# Patient Record
Sex: Female | Born: 1965 | Race: White | Hispanic: No | Smoking: Former smoker
Health system: Southern US, Community
[De-identification: ages and names within clinical notes are randomized; demographics above are authoritative.]

## PROBLEM LIST (undated history)

## (undated) DIAGNOSIS — F329 Major depressive disorder, single episode, unspecified: Secondary | ICD-10-CM

## (undated) DIAGNOSIS — F32A Depression, unspecified: Secondary | ICD-10-CM

## (undated) DIAGNOSIS — E119 Type 2 diabetes mellitus without complications: Secondary | ICD-10-CM

## (undated) DIAGNOSIS — I10 Essential (primary) hypertension: Secondary | ICD-10-CM

## (undated) HISTORY — DX: Major depressive disorder, single episode, unspecified: F32.9

## (undated) HISTORY — DX: Depression, unspecified: F32.A

## (undated) HISTORY — DX: Type 2 diabetes mellitus without complications: E11.9

## (undated) HISTORY — PX: PILONIDAL CYST EXCISION: SHX744

## (undated) HISTORY — DX: Essential (primary) hypertension: I10

## (undated) HISTORY — PX: CHOLECYSTECTOMY: SHX55

## (undated) HISTORY — PX: HERNIA REPAIR: SHX51

## (undated) HISTORY — PX: ABLATION: SHX5711

---

## 2006-01-28 ENCOUNTER — Ambulatory Visit: Payer: Self-pay | Admitting: Family Medicine

## 2006-02-18 ENCOUNTER — Ambulatory Visit: Payer: Self-pay | Admitting: Internal Medicine

## 2006-02-21 ENCOUNTER — Ambulatory Visit: Payer: Self-pay | Admitting: Internal Medicine

## 2006-05-03 ENCOUNTER — Ambulatory Visit: Payer: Self-pay | Admitting: Internal Medicine

## 2006-05-24 ENCOUNTER — Ambulatory Visit: Payer: Self-pay | Admitting: Internal Medicine

## 2007-05-22 ENCOUNTER — Ambulatory Visit: Payer: Self-pay | Admitting: Family Medicine

## 2009-01-05 ENCOUNTER — Ambulatory Visit: Payer: Self-pay | Admitting: Family Medicine

## 2009-07-14 ENCOUNTER — Ambulatory Visit: Payer: Self-pay | Admitting: Family Medicine

## 2011-10-31 ENCOUNTER — Ambulatory Visit: Payer: Self-pay

## 2011-11-08 ENCOUNTER — Ambulatory Visit: Payer: Self-pay

## 2011-11-12 LAB — PATHOLOGY REPORT

## 2014-06-08 ENCOUNTER — Ambulatory Visit: Payer: Self-pay | Admitting: Family Medicine

## 2014-06-21 ENCOUNTER — Ambulatory Visit: Payer: Self-pay | Admitting: Surgery

## 2014-06-21 LAB — BASIC METABOLIC PANEL
ANION GAP: 4 — AB (ref 7–16)
BUN: 12 mg/dL (ref 7–18)
CALCIUM: 9 mg/dL (ref 8.5–10.1)
CO2: 27 mmol/L (ref 21–32)
CREATININE: 1.03 mg/dL (ref 0.60–1.30)
Chloride: 104 mmol/L (ref 98–107)
EGFR (African American): >60
EGFR (Non-African Amer.): >60
Glucose: 139 mg/dL — ABNORMAL HIGH (ref 65–99)
Osmolality: 272 (ref 275–301)
POTASSIUM: 4.1 mmol/L (ref 3.5–5.1)
Sodium: 135 mmol/L — ABNORMAL LOW (ref 136–145)

## 2014-06-21 LAB — CBC WITH DIFFERENTIAL/PLATELET
BASOS PCT: 0.4 %
Basophil #: 0 10*3/uL (ref 0.0–0.1)
EOS ABS: 0.2 10*3/uL (ref 0.0–0.7)
Eosinophil %: 1.7 %
HCT: 40.3 % (ref 35.0–47.0)
HGB: 13.4 g/dL (ref 12.0–16.0)
LYMPHS ABS: 2.8 10*3/uL (ref 1.0–3.6)
Lymphocyte %: 26.5 %
MCH: 28.1 pg (ref 26.0–34.0)
MCHC: 33.3 g/dL (ref 32.0–36.0)
MCV: 84 fL (ref 80–100)
Monocyte #: 0.4 x10 3/mm (ref 0.2–0.9)
Monocyte %: 3.6 %
NEUTROS PCT: 67.8 %
Neutrophil #: 7.1 10*3/uL — ABNORMAL HIGH (ref 1.4–6.5)
Platelet: 283 10*3/uL (ref 150–440)
RBC: 4.78 10*6/uL (ref 3.80–5.20)
RDW: 14.4 % (ref 11.5–14.5)
WBC: 10.5 10*3/uL (ref 3.6–11.0)

## 2014-06-29 ENCOUNTER — Ambulatory Visit: Payer: Self-pay | Admitting: Surgery

## 2014-07-01 LAB — PATHOLOGY REPORT

## 2014-08-24 DIAGNOSIS — E119 Type 2 diabetes mellitus without complications: Secondary | ICD-10-CM

## 2014-08-24 HISTORY — DX: Type 2 diabetes mellitus without complications: E11.9

## 2014-08-31 DIAGNOSIS — E538 Deficiency of other specified B group vitamins: Secondary | ICD-10-CM | POA: Insufficient documentation

## 2014-08-31 DIAGNOSIS — E119 Type 2 diabetes mellitus without complications: Secondary | ICD-10-CM | POA: Insufficient documentation

## 2014-10-25 ENCOUNTER — Ambulatory Visit: Payer: Self-pay | Admitting: Surgery

## 2014-10-25 DIAGNOSIS — I10 Essential (primary) hypertension: Secondary | ICD-10-CM

## 2014-10-25 LAB — BASIC METABOLIC PANEL
ANION GAP: 8 (ref 7–16)
BUN: 18 mg/dL (ref 7–18)
CHLORIDE: 101 mmol/L (ref 98–107)
CREATININE: 0.91 mg/dL (ref 0.60–1.30)
Calcium, Total: 9 mg/dL (ref 8.5–10.1)
Co2: 28 mmol/L (ref 21–32)
Glucose: 129 mg/dL — ABNORMAL HIGH (ref 65–99)
OSMOLALITY: 277 (ref 275–301)
Potassium: 4 mmol/L (ref 3.5–5.1)
SODIUM: 137 mmol/L (ref 136–145)

## 2014-11-02 ENCOUNTER — Ambulatory Visit: Payer: Self-pay | Admitting: Surgery

## 2015-04-16 NOTE — Op Note (Signed)
PATIENT NAME:  Holly Foley, Holly Foley MR#:  [id number] DATE OF BIRTH:  1966-05-01  DATE OF PROCEDURE:  06/29/2014  PREOPERATIVE DIAGNOSIS: Symptomatic cholelithiasis.   POSTOPERATIVE DIAGNOSIS: Symptomatic cholelithiasis.   PROCEDURE PERFORMED: Laparoscopic cholecystectomy.   ANESTHESIA: General endotracheal.   ESTIMATED BLOOD LOSS: Minimal.   COMPLICATIONS: None.   SPECIMENS: Gallbladder.   INDICATION FOR SURGERY: [name] is a pleasant [age] year old with recurrent right upper quadrant pain, nausea and vomiting. She has gallstones on ultrasound and her characteristic appeared to be biliary, thus she was brought to the operating room for laparoscopic cholecystectomy.   DETAILS OF PROCEDURE: Informed consent was obtained. [name] was brought to the operating room suite. She was induced, an endotracheal tube was placed, general anesthesia was administered. Her abdomen was prepped and draped in standard surgical fashion. A timeout was then performed correctly identifying the patient name, operative site and procedure to be performed.   A supraumbilical incision was made. It was deepened down to the fascia. The fascia was incised. The peritoneum was entered. Two stay sutures were placed through the fasciotomy. The abdomen was insufflated. A Hasson trocar was placed in the abdomen. An 11-mm epigastric and two 5-mm right subcostal trocars were placed in the midclavicular area and anterior axillary lines. The gallbladder was lifted up over dome of the liver. There were obvious stones in the gallbladder. The cystic artery and cystic duct were dissected out. The critical view was obtained. Two structures were ligated clipped and ligated. The gallbladder was then taken off the gallbladder fossa with hook cautery and brought out with an Endo Catch bag.   The gallbladder fossa was then irrigated and made hemostatic. The abdomen was irrigated with normal saline. When I was happy with hemostasis, all trocars  were removed under direct visualization. The abdomen was desufflated. The supraumbilical fascia was closed with a figure-of-eight 0-Vicryl. All port sites were infiltrated with 1% lidocaine with epinephrine and closed with interrupted 4-0 Monocryl deep dermal sutures. The wounds were then dressed with Steri-Strips, Telfa gauze and Tegaderm. The patient was then awakened, extubated and brought to the postanesthesia care unit. There were no immediate complications. Needle, sponge, and instrument counts were correct at the end of the procedure.    ____________________________ Si Raiderhristopher A. Charolett Yarrow, MD cal:lt Foley: 06/30/2014 09:02:57 ET T: 06/30/2014 12:29:20 ET JOB#: [id number]  cc: Cristal Deerhristopher A. Dalena Plantz, MD, <Dictator> Jarvis NewcomerHRISTOPHER A Ronelle Smallman MD ELECTRONICALLY SIGNED 07/04/2014 18:12

## 2015-04-16 NOTE — Op Note (Signed)
PATIENT NAME:  Holly Foley, Holly Foley MR#:  [id number] DATE OF BIRTH:  03-Jun-1966  DATE OF PROCEDURE:  11/02/2014  PREOPERATIVE DIAGNOSIS: Incisional hernia.   POSTOPERATIVE DIAGNOSIS: Incisional hernia x 2.   PROCEDURE PERFORMED: Incisional hernia repair with mesh, 8 cm diameter Ventralex light prolene.   ANESTHESIA: General.   ESTIMATED BLOOD LOSS: 10 mL.   COMPLICATIONS: None.   SPECIMENS: Hernia sac.   INDICATION FOR SURGERY: Ms. Holly Foley is a pleasant, 49 year old, active female, who is status post cholecystectomy, presents with a supraumbilical bulge thought to be a hernia. She was brought to the operating room suite for a hernia repair.   DETAILS OF PROCEDURE AS FOLLOWS: Informed consent was obtained. The patient was brought to the operating room suite. She was induced. Endotracheal tube was placed, general anesthesia was administered. Her abdomen was prepped and draped in standard surgical fashion. A timeout was then performed correctly identifying the patient and name, operative site and procedure to be performed.   An incision left of the umbilicus was made. It was deepened down to the fascia. A hernia defect superior to the umbilicus was noted. This was dissected out. The sac was ligated. It was approximately 2 x 1 cm. A 1 x 1 cm defect was found approximately 1 cm up. All defects were connected, after dissected out, and the hernia was repaired using an 8 cm Ventralex patch. This was incorporated into the closure of the wound with 0 Ethibond interrupted sutures incorporating the tail. After this the umbilicus was then reattached with a 3-0 Vicryl inverted U-stitch. The wound was then closed in 2 layers, an interrupted 3-0 Vicryl figure-of-eight for the deep, and then running Monocryl 4-0 subcuticular. Steri-Strips, Telfa gauze and Tegaderm were then used to complete the dressing.   The patient was then awoken, extubated and brought to the postanesthesia care unit. There were no immediate  complications.   Needle, sponge, and instrument counts were correct at the end of the procedure.    ____________________________ Si Raiderhristopher A. Chayne Baumgart, MD cal:JT Foley: 11/04/2014 07:10:08 ET T: 11/04/2014 12:39:41 ET JOB#: [id number]  cc: Cristal Deerhristopher A. Wakisha Alberts, MD, <Dictator> Jarvis NewcomerHRISTOPHER A Lautaro Koral MD ELECTRONICALLY SIGNED 11/07/2014 19:34

## 2015-04-25 ENCOUNTER — Other Ambulatory Visit: Payer: Self-pay | Admitting: Family Medicine

## 2015-04-25 DIAGNOSIS — Z1231 Encounter for screening mammogram for malignant neoplasm of breast: Secondary | ICD-10-CM

## 2015-05-10 ENCOUNTER — Ambulatory Visit
Admission: RE | Admit: 2015-05-10 | Discharge: 2015-05-10 | Disposition: A | Discharge status: Home / Self Care | Payer: Commercial Indemnity | Source: Ambulatory Visit | Attending: Neurology | Admitting: Neurology

## 2015-05-10 DIAGNOSIS — Z1231 Encounter for screening mammogram for malignant neoplasm of breast: Secondary | ICD-10-CM | POA: Insufficient documentation

## 2015-10-06 ENCOUNTER — Other Ambulatory Visit: Payer: Self-pay | Admitting: Family Medicine

## 2015-10-06 ENCOUNTER — Other Ambulatory Visit: Payer: Self-pay

## 2015-10-06 DIAGNOSIS — F32A Depression, unspecified: Secondary | ICD-10-CM

## 2015-10-06 DIAGNOSIS — F329 Major depressive disorder, single episode, unspecified: Secondary | ICD-10-CM

## 2015-10-06 MED ORDER — SERTRALINE HCL 50 MG PO TABS: 50.0000 mg | ORAL_TABLET | Freq: Every day | ORAL | Status: DC

## 2016-01-31 ENCOUNTER — Encounter: Payer: Self-pay | Admitting: Family Medicine

## 2016-01-31 ENCOUNTER — Ambulatory Visit (INDEPENDENT_AMBULATORY_CARE_PROVIDER_SITE_OTHER): Payer: Commercial Indemnity | Admitting: Family Medicine

## 2016-01-31 VITALS — BP 118/64 | HR 80 | Ht 60.0 in | Wt 210.0 lb

## 2016-01-31 DIAGNOSIS — E785 Hyperlipidemia, unspecified: Secondary | ICD-10-CM | POA: Diagnosis not present

## 2016-01-31 DIAGNOSIS — I10 Essential (primary) hypertension: Secondary | ICD-10-CM | POA: Diagnosis not present

## 2016-01-31 DIAGNOSIS — R000 Tachycardia, unspecified: Secondary | ICD-10-CM | POA: Insufficient documentation

## 2016-01-31 DIAGNOSIS — F329 Major depressive disorder, single episode, unspecified: Secondary | ICD-10-CM | POA: Diagnosis not present

## 2016-01-31 DIAGNOSIS — F32A Depression, unspecified: Secondary | ICD-10-CM

## 2016-01-31 DIAGNOSIS — R55 Syncope and collapse: Secondary | ICD-10-CM | POA: Insufficient documentation

## 2016-01-31 HISTORY — DX: Hyperlipidemia, unspecified: E78.5

## 2016-01-31 MED ORDER — VALSARTAN 80 MG PO TABS: 80.0000 mg | ORAL_TABLET | Freq: Every day | ORAL | Status: DC

## 2016-01-31 MED ORDER — SERTRALINE HCL 50 MG PO TABS: 50.0000 mg | ORAL_TABLET | Freq: Every day | ORAL | Status: DC

## 2016-01-31 NOTE — Progress Notes (Signed)
Name: Holly Foley   MRN: [id number]    DOB: 1966/04/29   Date:01/31/2016       Progress Note  Subjective  Chief Complaint  Chief Complaint  Patient presents with  . Hypertension  . Depression    Hypertension This is a chronic problem. The current episode started more than 1 year ago. The problem has been gradually improving since onset. The problem is controlled. Pertinent negatives include no anxiety, blurred vision, chest pain, headaches, malaise/fatigue, neck pain, orthopnea, palpitations, peripheral edema, PND, shortness of breath or sweats. There are no associated agents to hypertension. Risk factors for coronary artery disease include diabetes mellitus, dyslipidemia and obesity. Past treatments include angiotensin blockers. The current treatment provides mild improvement. There are no compliance problems.  There is no history of angina, kidney disease, CAD/MI, CVA, heart failure, left ventricular hypertrophy, PVD, renovascular disease or retinopathy. There is no history of chronic renal disease or a hypertension causing med.  Depression        This is a new problem.  The current episode started more than 1 year ago.   The onset quality is gradual.   The problem occurs intermittently.  The problem has been waxing and waning since onset.  Associated symptoms include no helplessness, no hopelessness, does not have insomnia, not irritable, no myalgias, no headaches, not sad and no suicidal ideas.  Past treatments include SSRIs - Selective serotonin reuptake inhibitors.  Compliance with treatment is good.   Pertinent negatives include no anxiety.   No problem-specific assessment & plan notes found for this encounter.   Past Medical History  Diagnosis Date  . Diabetes mellitus without complication (HCC)   . Depression   . Hypertension     Past Surgical History  Procedure Laterality Date  . Ablation    . Pilonidal cyst excision    . Cholecystectomy    . Hernia repair      No  family history on file.  Social History   Social History  . Marital Status: Unknown    Spouse Name: N/A  . Number of Children: N/A  . Years of Education: N/A   Occupational History  . Not on file.   Social History Main Topics  . Smoking status: Former Games developer  . Smokeless tobacco: Not on file  . Alcohol Use: 0.0 oz/week    0 Standard drinks or equivalent per week  . Drug Use: No  . Sexual Activity: Not on file   Other Topics Concern  . Not on file   Social History Narrative    No Known Allergies   Review of Systems  Constitutional: Negative for fever, chills, weight loss and malaise/fatigue.  HENT: Negative for ear discharge, ear pain and sore throat.   Eyes: Negative for blurred vision.  Respiratory: Negative for cough, sputum production, shortness of breath and wheezing.   Cardiovascular: Negative for chest pain, palpitations, orthopnea, leg swelling and PND.  Gastrointestinal: Negative for heartburn, nausea, abdominal pain, diarrhea, constipation, blood in stool and melena.  Genitourinary: Negative for dysuria, urgency, frequency and hematuria.  Musculoskeletal: Negative for myalgias, back pain, joint pain and neck pain.  Skin: Negative for rash.  Neurological: Negative for dizziness, tingling, sensory change, focal weakness and headaches.  Endo/Heme/Allergies: Negative for environmental allergies and polydipsia. Does not bruise/bleed easily.  Psychiatric/Behavioral: Positive for depression. Negative for suicidal ideas. The patient is not nervous/anxious and does not have insomnia.      Objective  Filed Vitals:   01/31/16 0906  BP:  118/64  Pulse: 80  Height: 5' (1.524 m)  Weight: 210 lb (95.255 kg)    Physical Exam  Constitutional: She is well-developed, well-nourished, and in no distress. She is not irritable. No distress.  HENT:  Head: Normocephalic and atraumatic.  Right Ear: External ear normal.  Left Ear: External ear normal.  Nose: Nose normal.   Mouth/Throat: Oropharynx is clear and moist.  Eyes: Conjunctivae and EOM are normal. Pupils are equal, round, and reactive to light. Right eye exhibits no discharge. Left eye exhibits no discharge.  Neck: Normal range of motion. Neck supple. No JVD present. No thyromegaly present.  Cardiovascular: Normal rate, regular rhythm, normal heart sounds and intact distal pulses.  Exam reveals no gallop and no friction rub.   No murmur heard. Pulmonary/Chest: Effort normal and breath sounds normal.  Abdominal: Soft. Bowel sounds are normal. She exhibits no mass. There is no tenderness. There is no guarding.  Musculoskeletal: Normal range of motion. She exhibits no edema.  Lymphadenopathy:    She has no cervical adenopathy.  Neurological: She is alert. She has normal reflexes.  Skin: Skin is warm and dry. She is not diaphoretic.  Psychiatric: Mood and affect normal.      Assessment & Plan  Problem List Items Addressed This Visit      Cardiovascular and Mediastinum   BP (high blood pressure) - Primary   Relevant Medications   aspirin EC 81 MG tablet   valsartan (DIOVAN) 80 MG tablet   Other Relevant Orders   Renal Function Panel     Other   HLD (hyperlipidemia)   Relevant Medications   aspirin EC 81 MG tablet   valsartan (DIOVAN) 80 MG tablet   Other Relevant Orders   Lipid Profile    Other Visit Diagnoses    Depression        Relevant Medications    sertraline (ZOLOFT) 50 MG tablet         Dr. Hayden [facility]  01/31/2016

## 2016-02-01 LAB — RENAL FUNCTION PANEL
Albumin: 3.9 g/dL (ref 3.5–5.5)
BUN / CREAT RATIO: 19 (ref 9–23)
BUN: 14 mg/dL (ref 6–24)
CHLORIDE: 102 mmol/L (ref 96–106)
CO2: 23 mmol/L (ref 18–29)
Calcium: 9.4 mg/dL (ref 8.7–10.2)
Creatinine, Ser: 0.72 mg/dL (ref 0.57–1.00)
GFR, EST AFRICAN AMERICAN: 114 mL/min/{1.73_m2} (ref >59)
GFR, EST NON AFRICAN AMERICAN: 99 mL/min/{1.73_m2} (ref >59)
GLUCOSE: 156 mg/dL — AB (ref 65–99)
POTASSIUM: 4.3 mmol/L (ref 3.5–5.2)
Phosphorus: 3 mg/dL (ref 2.5–4.5)
Sodium: 138 mmol/L (ref 134–144)

## 2016-02-01 LAB — LIPID PANEL
Chol/HDL Ratio: 3 ratio units (ref 0.0–4.4)
Cholesterol, Total: 155 mg/dL (ref 100–199)
HDL: 51 mg/dL (ref >39)
LDL Calculated: 75 mg/dL (ref 0–99)
Triglycerides: 144 mg/dL (ref 0–149)
VLDL CHOLESTEROL CAL: 29 mg/dL (ref 5–40)

## 2016-04-13 ENCOUNTER — Ambulatory Visit (INDEPENDENT_AMBULATORY_CARE_PROVIDER_SITE_OTHER): Payer: Commercial Indemnity | Admitting: Family Medicine

## 2016-04-13 ENCOUNTER — Encounter: Payer: Self-pay | Admitting: Family Medicine

## 2016-04-13 VITALS — BP 118/86 | HR 82 | Temp 97.8°F | Resp 16 | Ht 60.0 in | Wt 205.6 lb

## 2016-04-13 DIAGNOSIS — Z1239 Encounter for other screening for malignant neoplasm of breast: Secondary | ICD-10-CM

## 2016-04-13 DIAGNOSIS — Z0000 Encounter for general adult medical examination without abnormal findings: Secondary | ICD-10-CM | POA: Diagnosis not present

## 2016-04-13 NOTE — Progress Notes (Signed)
Name: Holly Foley   MRN: [id number]    DOB: 1966/03/01   Date:04/13/2016       Progress Note  Subjective  Chief Complaint  Chief Complaint  Patient presents with  . Gynecologic Exam    HPI Comments: Patient presents for annual physical exam.  Gynecologic Exam The patient's pertinent negatives include no genital itching, genital lesions, genital odor, genital rash, missed menses, pelvic pain, vaginal bleeding or vaginal discharge. The patient is experiencing no pain. Pertinent negatives include no abdominal pain, anorexia, back pain, chills, constipation, diarrhea, dysuria, fever, flank pain, frequency, headaches, hematuria, joint pain, nausea, painful intercourse, rash, sore throat or urgency. She is postmenopausal.    No problem-specific assessment & plan notes found for this encounter.   Past Medical History  Diagnosis Date  . Diabetes mellitus without complication (HCC)   . Depression   . Hypertension     Past Surgical History  Procedure Laterality Date  . Ablation    . Pilonidal cyst excision    . Cholecystectomy    . Hernia repair      History reviewed. No pertinent family history.  Social History   Social History  . Marital Status: Unknown    Spouse Name: N/A  . Number of Children: N/A  . Years of Education: N/A   Occupational History  . Not on file.   Social History Main Topics  . Smoking status: Former Games developer  . Smokeless tobacco: Not on file  . Alcohol Use: 0.0 oz/week    0 Standard drinks or equivalent per week  . Drug Use: No  . Sexual Activity: Not on file   Other Topics Concern  . Not on file   Social History Narrative    No Known Allergies   Review of Systems  Constitutional: Negative for fever, chills, weight loss and malaise/fatigue.  HENT: Negative for ear discharge, ear pain and sore throat.   Eyes: Negative for blurred vision.  Respiratory: Negative for cough, sputum production, shortness of breath and wheezing.    Cardiovascular: Negative for chest pain, palpitations and leg swelling.  Gastrointestinal: Negative for heartburn, nausea, abdominal pain, diarrhea, constipation, blood in stool, melena and anorexia.  Genitourinary: Negative for dysuria, urgency, frequency, hematuria, flank pain, vaginal discharge, pelvic pain and missed menses.  Musculoskeletal: Negative for myalgias, back pain, joint pain and neck pain.  Skin: Negative for rash.  Neurological: Negative for dizziness, tingling, sensory change, focal weakness and headaches.  Endo/Heme/Allergies: Negative for environmental allergies and polydipsia. Does not bruise/bleed easily.  Psychiatric/Behavioral: Negative for depression and suicidal ideas. The patient is not nervous/anxious and does not have insomnia.      Objective  Filed Vitals:   04/13/16 0912  BP: 118/86  Pulse: 82  Temp: 97.8 F (36.6 C)  TempSrc: Oral  Resp: 16  Height: 5' (1.524 m)  Weight: 205 lb 9.6 oz (93.26 kg)  SpO2: 97%    Physical Exam  Constitutional: She is well-developed, well-nourished, and in no distress. No distress.  HENT:  Head: Normocephalic and atraumatic.  Right Ear: External ear normal.  Left Ear: External ear normal.  Nose: Nose normal.  Mouth/Throat: Oropharynx is clear and moist.  Eyes: Conjunctivae and EOM are normal. Pupils are equal, round, and reactive to light. Right eye exhibits no discharge. Left eye exhibits no discharge.  Neck: Normal range of motion. Neck supple. No JVD present. No thyromegaly present.  Cardiovascular: Normal rate, regular rhythm, normal heart sounds and intact distal pulses.  Exam reveals no  gallop and no friction rub.   No murmur heard. Pulmonary/Chest: Effort normal and breath sounds normal. Right breast exhibits no inverted nipple, no mass, no nipple discharge, no skin change and no tenderness. Left breast exhibits no inverted nipple, no mass, no nipple discharge, no skin change and no tenderness. Breasts are  symmetrical.  Abdominal: Soft. Bowel sounds are normal. She exhibits no mass. There is no tenderness. There is no guarding.  Genitourinary: Rectum normal, vagina normal, uterus normal, cervix normal, right adnexa normal, left adnexa normal and vulva normal.  Musculoskeletal: Normal range of motion. She exhibits no edema.  Lymphadenopathy:    She has no cervical adenopathy.  Neurological: She is alert. She has normal reflexes.  Skin: Skin is warm and dry. She is not diaphoretic.  Psychiatric: Mood and affect normal.  Nursing note and vitals reviewed.     Assessment & Plan  Problem List Items Addressed This Visit    None    Visit Diagnoses    Annual physical exam    -  Primary    Relevant Orders    Cytology - PAP    Breast cancer screening        Relevant Orders    MM Digital Screening         Dr. Elizabeth Sauereanna Shakeyla Giebler [facility]  04/13/2016

## 2016-04-18 LAB — PAP IG (IMAGE GUIDED): PAP Smear Comment: 0

## 2016-05-10 ENCOUNTER — Ambulatory Visit
Admission: RE | Admit: 2016-05-10 | Discharge: 2016-05-10 | Disposition: A | Discharge status: Home / Self Care | Payer: Managed Care, Other (non HMO) | Source: Ambulatory Visit | Attending: Family Medicine | Admitting: Family Medicine

## 2016-05-10 DIAGNOSIS — Z1239 Encounter for other screening for malignant neoplasm of breast: Secondary | ICD-10-CM

## 2016-05-10 DIAGNOSIS — Z1231 Encounter for screening mammogram for malignant neoplasm of breast: Secondary | ICD-10-CM | POA: Diagnosis present

## 2016-06-22 ENCOUNTER — Other Ambulatory Visit: Payer: Self-pay

## 2016-06-22 ENCOUNTER — Ambulatory Visit (INDEPENDENT_AMBULATORY_CARE_PROVIDER_SITE_OTHER): Payer: Managed Care, Other (non HMO) | Admitting: Surgery

## 2016-06-22 ENCOUNTER — Encounter: Payer: Self-pay | Admitting: Surgery

## 2016-06-22 VITALS — BP 130/87 | HR 89 | Temp 98.3°F | Wt 208.0 lb

## 2016-06-22 DIAGNOSIS — R1033 Periumbilical pain: Secondary | ICD-10-CM | POA: Diagnosis not present

## 2016-06-22 NOTE — Patient Instructions (Signed)
CT-scan of the abdomen will be on 06/29/2016 at 11:00 AM. Please arrive 15 minutes prior to your appointment. This will be done at the [facility]. Please pick up your Prep-Kit 24 hours prior to your appointment.

## 2016-06-25 ENCOUNTER — Encounter: Payer: Self-pay | Admitting: Surgery

## 2016-06-25 DIAGNOSIS — R1033 Periumbilical pain: Secondary | ICD-10-CM | POA: Insufficient documentation

## 2016-06-25 NOTE — Progress Notes (Signed)
Subjective:     Patient ID: Holly Foley, female   DOB: [birth year], 50 y.o.   MRN: [id number]  HPI  50 yr old female with well controlled diabetes and hypertension comes in today With complaint of feeling a bulge around her previous hernia and incision sites. Patient had a laparoscopic cholecystectomy performed in July 2015 with my partner Dr. Juliann PulseLundquist and subsequently a incidental hernia repair in November 2015 with an 8 cm mesh placement at that time. Patient states that she had been doing well since that time but over the past few months she has noticed a dull pain in the umbilical area and has noticed horrible bulge in the area. Patient states that she does not lift things that are heavy and does notice it more after a long day of working. Patient states that the pain in the area is more a dull ache about a 2 out of 10.  Patient has seen her PCP for this and they felt she just had some scar tissue in the area. Patient states that since it has not improved any that she wanted to be evaluated for hernia.  Patient denies any fever, chills, nausea, vomiting, other abdominal pain or dysuria.   Past Medical History  Diagnosis Date  . Diabetes mellitus without complication (HCC)   . Depression   . Hypertension    Past Surgical History  Procedure Laterality Date  . Ablation    . Pilonidal cyst excision    . Cholecystectomy    . Hernia repair     Family History  Problem Relation Age of Onset  . Breast cancer Neg Hx    Social History   Social History  . Marital Status: Unknown    Spouse Name: N/A  . Number of Children: N/A  . Years of Education: N/A   Social History Main Topics  . Smoking status: Former Games developermoker  . Smokeless tobacco: None  . Alcohol Use: 0.0 oz/week    0 Standard drinks or equivalent per week  . Drug Use: No  . Sexual Activity: Not Asked   Other Topics Concern  . None   Social History Narrative    Current outpatient prescriptions:  .  aspirin EC 81 MG tablet,  Take 1 tablet by mouth daily., Disp: , Rfl:  .  Dulaglutide (TRULICITY) 1.5 MG/0.5ML SOPN, Inject into the skin. Dr Renae FicklePaul, Disp: , Rfl:  .  empagliflozin (JARDIANCE) 25 MG TABS tablet, Take by mouth. Dr Renae FicklePaul, Disp: , Rfl:  .  glucose blood (ONE TOUCH ULTRA TEST) test strip, Dr Renae FicklePaul, Disp: , Rfl:  .  Insulin Degludec (TRESIBA FLEXTOUCH) 100 UNIT/ML SOPN, Inject into the skin. Dr Renae FicklePaul, Disp: , Rfl:  .  Insulin Pen Needle (BD PEN NEEDLE NANO U/F) 32G X 4 MM MISC, Use 2 pen needles daily with insulin injection, Disp: , Rfl:  .  metFORMIN (GLUCOPHAGE) 1000 MG tablet, Take 1,000 mg by mouth 2 (two) times daily with a meal. Dr Renae FicklePaul, Disp: , Rfl:  .  metFORMIN (GLUCOPHAGE-XR) 500 MG 24 hr tablet, Take 1 tablet by mouth., Disp: , Rfl: 0 .  sertraline (ZOLOFT) 50 MG tablet, Take 1 tablet (50 mg total) by mouth daily., Disp: 90 tablet, Rfl: 1 .  topiramate (TOPAMAX) 100 MG tablet, Take 1 tablet by mouth at bedtime. Dr Renae FicklePaul, Disp: , Rfl:  .  valsartan (DIOVAN) 80 MG tablet, Take 1 tablet (80 mg total) by mouth daily., Disp: 90 tablet, Rfl: 1 No Known Allergies  Review of Systems  Constitutional: Negative for fever, activity change, appetite change, fatigue and unexpected weight change.  HENT: Negative for congestion and sore throat.   Respiratory: Negative for cough, chest tightness, shortness of breath, wheezing and stridor.   Cardiovascular: Negative for chest pain, palpitations and leg swelling.  Gastrointestinal: Positive for abdominal pain. Negative for nausea, vomiting, diarrhea, constipation, blood in stool and abdominal distention.  Genitourinary: Negative for urgency, hematuria and flank pain.  Musculoskeletal: Negative for back pain and neck pain.  Skin: Negative for color change, pallor, rash and wound.  Neurological: Negative for dizziness and tremors.  Hematological: Negative for adenopathy. Does not bruise/bleed easily.  Psychiatric/Behavioral: Negative for agitation. The patient is  not nervous/anxious.   All other systems reviewed and are negative.      Filed Vitals:   06/22/16 1156  BP: 130/87  Pulse: 89  Temp: 98.3 F (36.8 C)    Objective:   Physical Exam  Constitutional: She is oriented to person, place, and time. She appears well-developed and well-nourished. No distress.  HENT:  Head: Normocephalic and atraumatic.  Right Ear: External ear normal.  Left Ear: External ear normal.  Nose: Nose normal.  Mouth/Throat: Oropharynx is clear and moist. No oropharyngeal exudate.  Eyes: Conjunctivae and EOM are normal. Pupils are equal, round, and reactive to light. No scleral icterus.  Neck: Normal range of motion. Neck supple. No tracheal deviation present.  Cardiovascular: Normal rate, regular rhythm, normal heart sounds and intact distal pulses.  Exam reveals no gallop and no friction rub.   No murmur heard. Pulmonary/Chest: Effort normal and breath sounds normal. No respiratory distress. She has no wheezes. She has no rales.  Abdominal: Soft. Bowel sounds are normal. She exhibits no distension and no mass. There is tenderness. There is no rebound and no guarding.  Obese habitus, Well-healed incision scars, at umbilical site scar well-healed 4 cm area which could be diastases was palpated not too much change with coughing and straining, edges of fascia unable to be palpated partially due to body habitus, some tenderness in the area with deep palpation  Musculoskeletal: Normal range of motion. She exhibits no edema or tenderness.  Neurological: She is alert and oriented to person, place, and time. No cranial nerve deficit.  Skin: Skin is warm and dry. No rash noted. No erythema. No pallor.  Psychiatric: She has a normal mood and affect. Her behavior is normal. Judgment and thought content normal.  Vitals reviewed.       Assessment:     50 yr old female with umbilical incision pain     Plan:     I reviewed the patient's past medical history with  well-controlled hypertension and diabetes as well as her previous operations. I reviewed that her last laboratory values and imaging were from 2015 when she had her gallbladder removed. I did discuss with the patient that given her pain in the area is possible that she has a recurrence of her hernia. However I am unable to palpate edges of the fascia and given that mesh was placed previously could just be a laxity in the tissue causing some feelings of bulging without being a true hernia recurrence. I discussed with the patient that the only way to tell if there is a true hernia recurrence and to determine the best type of operation she would need in that case was to get a CT scan which which show the area in question and diagnose a hernia as well as allow  me to see the edges of her fashion abdominal musculature. I did discuss with the patient that she does have a hernia recurrence that we can discuss either laparoscopic repair versus an open repair at that time. Patient is in agreement with getting a CT scan of returning to discuss findings.

## 2016-06-28 ENCOUNTER — Other Ambulatory Visit: Payer: Self-pay

## 2016-06-28 DIAGNOSIS — I10 Essential (primary) hypertension: Secondary | ICD-10-CM

## 2016-06-29 ENCOUNTER — Other Ambulatory Visit
Admission: RE | Admit: 2016-06-29 | Discharge: 2016-06-29 | Disposition: A | Discharge status: Home / Self Care | Payer: Managed Care, Other (non HMO) | Source: Ambulatory Visit | Attending: Surgery | Admitting: Surgery

## 2016-06-29 ENCOUNTER — Ambulatory Visit
Admission: RE | Admit: 2016-06-29 | Discharge: 2016-06-29 | Disposition: A | Discharge status: Home / Self Care | Payer: Managed Care, Other (non HMO) | Source: Ambulatory Visit | Attending: Surgery | Admitting: Surgery

## 2016-06-29 ENCOUNTER — Other Ambulatory Visit: Payer: Self-pay

## 2016-06-29 DIAGNOSIS — K429 Umbilical hernia without obstruction or gangrene: Secondary | ICD-10-CM | POA: Diagnosis not present

## 2016-06-29 DIAGNOSIS — R1033 Periumbilical pain: Secondary | ICD-10-CM

## 2016-06-29 DIAGNOSIS — K760 Fatty (change of) liver, not elsewhere classified: Secondary | ICD-10-CM | POA: Insufficient documentation

## 2016-06-29 DIAGNOSIS — I10 Essential (primary) hypertension: Secondary | ICD-10-CM

## 2016-06-29 LAB — CREATININE, SERUM
CREATININE: 0.78 mg/dL (ref 0.44–1.00)
GFR calc Af Amer: >60 mL/min (ref >60)
GFR calc non Af Amer: >60 mL/min (ref >60)

## 2016-06-29 MED ORDER — IOPAMIDOL (ISOVUE-300) INJECTION 61%
125.0000 mL | Freq: Once | INTRAVENOUS | Status: AC | PRN
  Administered 2016-06-29: 125 mL via INTRAVENOUS

## 2016-07-03 ENCOUNTER — Ambulatory Visit (INDEPENDENT_AMBULATORY_CARE_PROVIDER_SITE_OTHER): Payer: Managed Care, Other (non HMO) | Admitting: Surgery

## 2016-07-03 ENCOUNTER — Encounter: Payer: Self-pay | Admitting: Surgery

## 2016-07-03 VITALS — BP 106/65 | HR 76 | Temp 97.4°F | Ht 60.0 in | Wt 210.0 lb

## 2016-07-03 DIAGNOSIS — K432 Incisional hernia without obstruction or gangrene: Secondary | ICD-10-CM | POA: Insufficient documentation

## 2016-07-03 NOTE — Progress Notes (Signed)
Subjective:     Patient ID: Holly Foley, female   DOB: 14-Jul-1966, 50 y.o.   MRN: [id number]  HPI  50 yr old female with previous ventral hernia repair now with periumbilical pain. I saw her a few weeks ago and said her first CT scan of this area. Patient states that she is still having some pain in that area that is intermittent. Patient also states that she is occasionally nauseated and has vomited. Patient states that she vomited with the contrast and vomited after eating really greasy fatty foods. Patient states that since she's had her gallbladder out that this is been a frequent occurrence she is not attributed this to the pain in the hernia area.  Past Medical History  Diagnosis Date  . Diabetes mellitus without complication (HCC)   . Depression   . Hypertension    Past Surgical History  Procedure Laterality Date  . Ablation    . Pilonidal cyst excision    . Cholecystectomy    . Hernia repair     Family History  Problem Relation Age of Onset  . Breast cancer Neg Hx    Social History   Social History  . Marital Status: Unknown    Spouse Name: N/A  . Number of Children: N/A  . Years of Education: N/A   Social History Main Topics  . Smoking status: Former Games developer  . Smokeless tobacco: None  . Alcohol Use: 0.0 oz/week    0 Standard drinks or equivalent per week  . Drug Use: No  . Sexual Activity: Not Asked   Other Topics Concern  . None   Social History Narrative    Current outpatient prescriptions:  .  aspirin EC 81 MG tablet, Take 1 tablet by mouth daily., Disp: , Rfl:  .  Dulaglutide (TRULICITY) 1.5 MG/0.5ML SOPN, Inject into the skin. Dr Renae Fickle, Disp: , Rfl:  .  empagliflozin (JARDIANCE) 25 MG TABS tablet, Take by mouth. Dr Renae Fickle, Disp: , Rfl:  .  glucose blood (ONE TOUCH ULTRA TEST) test strip, Dr Renae Fickle, Disp: , Rfl:  .  Insulin Degludec (TRESIBA FLEXTOUCH) 100 UNIT/ML SOPN, Inject into the skin. Dr Renae Fickle, Disp: , Rfl:  .  Insulin Pen Needle (BD PEN NEEDLE NANO  U/F) 32G X 4 MM MISC, Use 2 pen needles daily with insulin injection, Disp: , Rfl:  .  metFORMIN (GLUCOPHAGE) 1000 MG tablet, Take 1,000 mg by mouth 2 (two) times daily with a meal. Dr Renae Fickle, Disp: , Rfl:  .  sertraline (ZOLOFT) 50 MG tablet, Take 1 tablet (50 mg total) by mouth daily., Disp: 90 tablet, Rfl: 1 .  topiramate (TOPAMAX) 100 MG tablet, Take 1 tablet by mouth at bedtime. Dr Renae Fickle, Disp: , Rfl:  .  valsartan (DIOVAN) 80 MG tablet, Take 1 tablet (80 mg total) by mouth daily., Disp: 90 tablet, Rfl: 1 No Known Allergies     Review of Systems  Constitutional: Negative for fever, chills, activity change, appetite change and fatigue.  HENT: Negative for congestion and sore throat.   Respiratory: Negative for cough, chest tightness, shortness of breath and stridor.   Cardiovascular: Negative for chest pain, palpitations and leg swelling.  Gastrointestinal: Positive for nausea, vomiting, abdominal pain and abdominal distention. Negative for diarrhea, constipation and blood in stool.  Genitourinary: Negative for dysuria and hematuria.  Musculoskeletal: Negative for back pain and neck pain.  Skin: Negative for color change and wound.  Neurological: Negative for dizziness and weakness.  Hematological: Negative for adenopathy.  Does not bruise/bleed easily.  Psychiatric/Behavioral: Negative for agitation. The patient is not nervous/anxious.   All other systems reviewed and are negative.      Filed Vitals:   07/03/16 1010  BP: 106/65  Pulse: 76  Temp: 97.4 F (36.3 C)    Objective:   Physical Exam  Constitutional: She is oriented to person, place, and time. She appears well-developed and well-nourished. No distress.  HENT:  Head: Normocephalic and atraumatic.  Right Ear: External ear normal.  Left Ear: External ear normal.  Nose: Nose normal.  Mouth/Throat: Oropharynx is clear and moist. No oropharyngeal exudate.  Eyes: Conjunctivae and EOM are normal. Pupils are equal, round,  and reactive to light. No scleral icterus.  Neck: Normal range of motion. Neck supple. No tracheal deviation present.  Cardiovascular: Normal rate, regular rhythm, normal heart sounds and intact distal pulses.  Exam reveals no gallop and no friction rub.   No murmur heard. Pulmonary/Chest: Effort normal and breath sounds normal. No respiratory distress. She has no wheezes. She has no rales.  Abdominal: Soft. She exhibits no distension and no mass. There is tenderness.  Obese habitus, Well-healed incision scars, at umbilical site scar well-healed 4 cm area no change with coughing and straining, edges of fascia unable to be palpated partially due to body habitus, some tenderness in the area with deep palpation   Neurological: She is alert and oriented to person, place, and time. No cranial nerve deficit.  Skin: Skin is warm and dry. No rash noted. No erythema. No pallor.  Psychiatric: She has a normal mood and affect. Her behavior is normal. Judgment and thought content normal.  Vitals reviewed.  CT scan:   IMPRESSION: 1. Fat containing supraumbilical hernia. 2. Hepatic steatosis.    Assessment:     [age]yr old female with recurrent ventral incisional hernia    Plan:     I discussed with the patient as she does have a small area of a fat-containing hernia just above the previous site of her incisional hernia at the superior aspect. I discussed with her that this is likely the cause of the pain and soreness in that area. I showed her the CT scan as well as the results. I also put the patient inside the cedar and which calculates complication risk as well as recurrence risk with hernia repair. The patient's current risk is 66% according to the app.  I did discuss with her that her BMI being at 41 and is a main contributor to this and decreasing her BMI to 34 which would mean a loss of 35 pounds would put her at a much less risk and a 40% range. Patient now has piece of mind that she knows what's going  on in the area and understands that there is just some fat contained in the area not bowel. The patient would like to attempt to lose some weight and will let us know if the pain increases or gets worse to the point where she would want repair.

## 2016-07-03 NOTE — Patient Instructions (Signed)
Please call our office if you have any questions or concerns.  

## 2016-07-11 ENCOUNTER — Other Ambulatory Visit: Payer: Self-pay | Admitting: Family Medicine

## 2016-09-11 ENCOUNTER — Other Ambulatory Visit: Payer: Self-pay

## 2016-09-11 MED ORDER — SERTRALINE HCL 50 MG PO TABS: 50.0000 mg | ORAL_TABLET | Freq: Every day | ORAL | 0 refills | Status: DC

## 2016-10-03 ENCOUNTER — Encounter: Payer: Self-pay | Admitting: Family Medicine

## 2016-10-03 ENCOUNTER — Ambulatory Visit (INDEPENDENT_AMBULATORY_CARE_PROVIDER_SITE_OTHER): Payer: Managed Care, Other (non HMO) | Admitting: Family Medicine

## 2016-10-03 VITALS — BP 130/70 | HR 78 | Ht 60.0 in | Wt 213.0 lb

## 2016-10-03 DIAGNOSIS — F329 Major depressive disorder, single episode, unspecified: Secondary | ICD-10-CM | POA: Diagnosis not present

## 2016-10-03 DIAGNOSIS — I10 Essential (primary) hypertension: Secondary | ICD-10-CM

## 2016-10-03 DIAGNOSIS — M25551 Pain in right hip: Secondary | ICD-10-CM | POA: Diagnosis not present

## 2016-10-03 MED ORDER — SERTRALINE HCL 50 MG PO TABS: 90.0000 mg | ORAL_TABLET | Freq: Every day | ORAL | 2 refills | Status: DC

## 2016-10-03 MED ORDER — SERTRALINE HCL 50 MG PO TABS: 50.0000 mg | ORAL_TABLET | Freq: Every day | ORAL | 1 refills | Status: DC

## 2016-10-03 MED ORDER — VALSARTAN 80 MG PO TABS: ORAL_TABLET | ORAL | 2 refills | Status: DC

## 2016-10-03 NOTE — Progress Notes (Signed)
Name: Holly Foley   MRN: [id number]    DOB: 06-29-1966   Date:10/03/2016       Progress Note  Subjective  Chief Complaint  Chief Complaint  Patient presents with  . Hypertension  . Depression    Hypertension  This is a chronic problem. The current episode started more than 1 year ago. The problem has been gradually improving since onset. The problem is controlled. Pertinent negatives include no anxiety, blurred vision, chest pain, headaches, malaise/fatigue, neck pain, orthopnea, palpitations, peripheral edema, PND, shortness of breath or sweats. There are no associated agents to hypertension. Risk factors for coronary artery disease include obesity. Past treatments include ACE inhibitors. The current treatment provides mild improvement. There are no compliance problems.  There is no history of angina, kidney disease, CAD/MI, CVA, heart failure, left ventricular hypertrophy, PVD, renovascular disease or retinopathy. There is no history of chronic renal disease or a hypertension causing med.  Depression       The patient presents with depression.  This is a chronic problem.  The current episode started more than 1 year ago.   The onset quality is gradual.   The problem occurs intermittently.  The problem has been gradually improving since onset.  Associated symptoms include sad.  Associated symptoms include no decreased concentration, no fatigue, no helplessness, no hopelessness, does not have insomnia, not irritable, no restlessness, no decreased interest, no appetite change, no body aches, no myalgias, no headaches, no indigestion and no suicidal ideas.( At times)     The symptoms are aggravated by nothing.  Past treatments include SSRIs - Selective serotonin reuptake inhibitors.  Compliance with treatment is good.  Previous treatment provided moderate relief.  Past medical history includes depression.     Pertinent negatives include no chronic fatigue syndrome, no anxiety and no eating  disorder.   No problem-specific Assessment & Plan notes found for this encounter.   Past Medical History:  Diagnosis Date  . Depression   . Diabetes mellitus without complication (HCC)   . Hypertension     Past Surgical History:  Procedure Laterality Date  . ABLATION    . CHOLECYSTECTOMY    . HERNIA REPAIR    . PILONIDAL CYST EXCISION      Family History  Problem Relation Age of Onset  . Breast cancer Neg Hx     Social History   Social History  . Marital status: Unknown    Spouse name: N/A  . Number of children: N/A  . Years of education: N/A   Occupational History  . Not on file.   Social History Main Topics  . Smoking status: Former Games developer  . Smokeless tobacco: Not on file  . Alcohol use 0.0 oz/week  . Drug use: No  . Sexual activity: Not on file   Other Topics Concern  . Not on file   Social History Narrative  . No narrative on file    No Known Allergies   Review of Systems  Constitutional: Negative for appetite change, chills, fatigue, fever, malaise/fatigue and weight loss.  HENT: Negative for ear discharge, ear pain and sore throat.   Eyes: Negative for blurred vision.  Respiratory: Negative for cough, sputum production, shortness of breath and wheezing.   Cardiovascular: Negative for chest pain, palpitations, orthopnea, leg swelling and PND.  Gastrointestinal: Negative for abdominal pain, blood in stool, constipation, diarrhea, heartburn, melena and nausea.  Genitourinary: Negative for dysuria, frequency, hematuria and urgency.  Musculoskeletal: Negative for back pain, joint pain,  myalgias and neck pain.  Skin: Negative for rash.  Neurological: Negative for dizziness, tingling, sensory change, focal weakness and headaches.  Endo/Heme/Allergies: Negative for environmental allergies and polydipsia. Does not bruise/bleed easily.  Psychiatric/Behavioral: Positive for depression. Negative for decreased concentration and suicidal ideas. The patient  is not nervous/anxious and does not have insomnia.      Objective  Vitals:   10/03/16 0905  BP: 130/70  Pulse: 78  Weight: 213 lb (96.6 kg)  Height: 5' (1.524 m)    Physical Exam  Constitutional: She is well-developed, well-nourished, and in no distress. She is not irritable. No distress.  HENT:  Head: Normocephalic and atraumatic.  Right Ear: External ear normal.  Left Ear: External ear normal.  Nose: Nose normal.  Mouth/Throat: Oropharynx is clear and moist.  Eyes: Conjunctivae and EOM are normal. Pupils are equal, round, and reactive to light. Right eye exhibits no discharge. Left eye exhibits no discharge.  Neck: Normal range of motion. Neck supple. No JVD present. No thyromegaly present.  Cardiovascular: Normal rate, regular rhythm, normal heart sounds and intact distal pulses.  Exam reveals no gallop and no friction rub.   No murmur heard. Pulmonary/Chest: Effort normal and breath sounds normal.  Abdominal: Soft. Bowel sounds are normal. She exhibits no mass. There is no tenderness. There is no guarding.  Musculoskeletal: Normal range of motion. She exhibits no edema.  Lymphadenopathy:    She has no cervical adenopathy.  Neurological: She is alert.  Skin: Skin is warm and dry. She is not diaphoretic.  Psychiatric: Mood and affect normal.  Nursing note and vitals reviewed.     Assessment & Plan  Problem List Items Addressed This Visit      Cardiovascular and Mediastinum   Essential hypertension - Primary   Relevant Medications   valsartan (DIOVAN) 80 MG tablet    Other Visit Diagnoses    Reactive depression       Relevant Medications   sertraline (ZOLOFT) 50 MG tablet   Right hip pain       Relevant Orders   Ambulatory referral to Physical Therapy        Dr. Elizabeth Sauereanna Freeda [facility]  10/03/16

## 2017-04-15 ENCOUNTER — Ambulatory Visit (INDEPENDENT_AMBULATORY_CARE_PROVIDER_SITE_OTHER): Payer: Managed Care, Other (non HMO) | Admitting: Family Medicine

## 2017-04-15 VITALS — BP 120/80 | HR 80 | Ht 60.0 in | Wt 197.0 lb

## 2017-04-15 DIAGNOSIS — Z23 Encounter for immunization: Secondary | ICD-10-CM | POA: Diagnosis not present

## 2017-04-15 DIAGNOSIS — F3341 Major depressive disorder, recurrent, in partial remission: Secondary | ICD-10-CM

## 2017-04-15 DIAGNOSIS — Z6839 Body mass index (BMI) 39.0-39.9, adult: Secondary | ICD-10-CM | POA: Diagnosis not present

## 2017-04-15 DIAGNOSIS — Z1211 Encounter for screening for malignant neoplasm of colon: Secondary | ICD-10-CM

## 2017-04-15 DIAGNOSIS — E669 Obesity, unspecified: Secondary | ICD-10-CM | POA: Diagnosis not present

## 2017-04-15 DIAGNOSIS — IMO0001 Reserved for inherently not codable concepts without codable children: Secondary | ICD-10-CM

## 2017-04-15 DIAGNOSIS — I10 Essential (primary) hypertension: Secondary | ICD-10-CM

## 2017-04-15 DIAGNOSIS — Z1239 Encounter for other screening for malignant neoplasm of breast: Secondary | ICD-10-CM

## 2017-04-15 DIAGNOSIS — F329 Major depressive disorder, single episode, unspecified: Secondary | ICD-10-CM | POA: Insufficient documentation

## 2017-04-15 DIAGNOSIS — Z1231 Encounter for screening mammogram for malignant neoplasm of breast: Secondary | ICD-10-CM | POA: Diagnosis not present

## 2017-04-15 DIAGNOSIS — Z0000 Encounter for general adult medical examination without abnormal findings: Secondary | ICD-10-CM | POA: Diagnosis not present

## 2017-04-15 HISTORY — DX: Reserved for inherently not codable concepts without codable children: IMO0001

## 2017-04-15 HISTORY — DX: Major depressive disorder, recurrent, in partial remission: F33.41

## 2017-04-15 LAB — HEMOCCULT GUIAC POC 1CARD (OFFICE): FECAL OCCULT BLD: NEGATIVE

## 2017-04-15 MED ORDER — SERTRALINE HCL 50 MG PO TABS: 50.0000 mg | ORAL_TABLET | Freq: Every day | ORAL | 2 refills | Status: DC

## 2017-04-15 MED ORDER — VALSARTAN 80 MG PO TABS: ORAL_TABLET | ORAL | 2 refills | Status: DC

## 2017-04-15 NOTE — Progress Notes (Signed)
Name: Holly Foley   MRN: [id number]    DOB: Jul 01, 1966   Date:04/15/2017       Progress Note  Subjective  Chief Complaint  Chief Complaint  Patient presents with  . Annual Exam    no pap- needs mammo and colonoscopy set up  . Depression    Patient presents for annual physical exam   Depression         This is a chronic problem.  The current episode started more than 1 year ago.   The problem has been gradually improving since onset.  Associated symptoms include no decreased concentration, no fatigue, no helplessness, no hopelessness, does not have insomnia, not irritable, no restlessness, no decreased interest, no appetite change, no body aches, no myalgias, no headaches, no indigestion, not sad and no suicidal ideas.     The symptoms are aggravated by nothing.  Past treatments include SSRIs - Selective serotonin reuptake inhibitors.  Compliance with treatment is good.  Previous treatment provided moderate relief.   Pertinent negatives include no anxiety. Hypertension  This is a chronic problem. The current episode started more than 1 year ago. The problem has been gradually improving since onset. The problem is controlled. Pertinent negatives include no anxiety, blurred vision, chest pain, headaches, malaise/fatigue, neck pain, orthopnea, palpitations, peripheral edema, PND, shortness of breath or sweats. There are no associated agents to hypertension. There are no known risk factors for coronary artery disease. Past treatments include ACE inhibitors. The current treatment provides mild improvement. There are no compliance problems.  There is no history of angina, kidney disease, CAD/MI, heart failure, left ventricular hypertrophy, PVD or retinopathy. There is no history of chronic renal disease, a hypertension causing med or renovascular disease.    No problem-specific Assessment & Plan notes found for this encounter.   Past Medical History:  Diagnosis Date  . Depression   . Diabetes  mellitus without complication (HCC)   . Hypertension     Past Surgical History:  Procedure Laterality Date  . ABLATION    . CHOLECYSTECTOMY    . HERNIA REPAIR    . PILONIDAL CYST EXCISION      Family History  Problem Relation Age of Onset  . Breast cancer Neg Hx     Social History   Social History  . Marital status: Unknown    Spouse name: N/A  . Number of children: N/A  . Years of education: N/A   Occupational History  . Not on file.   Social History Main Topics  . Smoking status: Former Games developer  . Smokeless tobacco: Not on file  . Alcohol use 0.0 oz/week  . Drug use: No  . Sexual activity: Not on file   Other Topics Concern  . Not on file   Social History Narrative  . No narrative on file    No Known Allergies  Outpatient Medications Prior to Visit  Medication Sig Dispense Refill  . aspirin EC 81 MG tablet Take 1 tablet by mouth daily.    . Dulaglutide (TRULICITY) 1.5 MG/0.5ML SOPN Inject into the skin. Dr Renae Fickle    . glucose blood (ONE TOUCH ULTRA TEST) test strip Dr Renae Fickle    . metFORMIN (GLUCOPHAGE) 1000 MG tablet Take 1,000 mg by mouth 2 (two) times daily with a meal. Dr Renae Fickle    . vitamin B-12 (CYANOCOBALAMIN) 1000 MCG tablet Take 1,000 mcg by mouth daily.    . Insulin Degludec (TRESIBA FLEXTOUCH) 100 UNIT/ML SOPN Inject into the skin. Dr Renae Fickle    .  sertraline (ZOLOFT) 50 MG tablet Take 1 tablet (50 mg total) by mouth daily. 90 tablet 1  . valsartan (DIOVAN) 80 MG tablet TAKE 1 TABLET DAILY.       SCHEDULE APPOINTMENT 90 tablet 2  . topiramate (TOPAMAX) 100 MG tablet Take 1 tablet by mouth at bedtime. Dr Renae Fickle    . empagliflozin (JARDIANCE) 25 MG TABS tablet Take by mouth. Dr Renae Fickle    . Insulin Pen Needle (BD PEN NEEDLE NANO U/F) 32G X 4 MM MISC Use 2 pen needles daily with insulin injection     No facility-administered medications prior to visit.     Review of Systems  Constitutional: Negative for appetite change, chills, fatigue, fever, malaise/fatigue  and weight loss.  HENT: Negative for ear discharge, ear pain and sore throat.   Eyes: Negative for blurred vision.  Respiratory: Negative for cough, sputum production, shortness of breath and wheezing.   Cardiovascular: Negative for chest pain, palpitations, orthopnea, leg swelling and PND.  Gastrointestinal: Negative for abdominal pain, blood in stool, constipation, diarrhea, heartburn, melena and nausea.  Genitourinary: Negative for dysuria, frequency, hematuria and urgency.  Musculoskeletal: Negative for back pain, joint pain, myalgias and neck pain.  Skin: Negative for rash.  Neurological: Negative for dizziness, tingling, sensory change, focal weakness and headaches.  Endo/Heme/Allergies: Negative for environmental allergies and polydipsia. Does not bruise/bleed easily.  Psychiatric/Behavioral: Positive for depression. Negative for decreased concentration and suicidal ideas. The patient is not nervous/anxious and does not have insomnia.      Objective  Vitals:   04/15/17 0825  BP: 120/80  Pulse: 80  Weight: 197 lb (89.4 kg)  Height: 5' (1.524 m)    Physical Exam  Constitutional: She is well-developed, well-nourished, and in no distress. She is not irritable. No distress.  HENT:  Head: Normocephalic and atraumatic.  Right Ear: Tympanic membrane, external ear and ear canal normal.  Left Ear: Tympanic membrane, external ear and ear canal normal.  Nose: Nose normal.  Mouth/Throat: Uvula is midline, oropharynx is clear and moist and mucous membranes are normal.  Eyes: Conjunctivae and EOM are normal. Pupils are equal, round, and reactive to light. Right eye exhibits no discharge. Left eye exhibits no discharge.  Fundoscopic exam:      The right eye shows no arteriolar narrowing, no AV nicking and no papilledema.       The left eye shows no arteriolar narrowing, no AV nicking and no papilledema.  Neck: Trachea normal and normal range of motion. Neck supple. Normal carotid pulses,  no hepatojugular reflux and no JVD present. Carotid bruit is not present. No thyromegaly present.  Cardiovascular: Normal rate, regular rhythm, S1 normal, S2 normal, normal heart sounds, intact distal pulses and normal pulses.  PMI is not displaced.  Exam reveals no gallop, no S3, no S4 and no friction rub.   No murmur heard. Pulmonary/Chest: Effort normal and breath sounds normal. Right breast exhibits no inverted nipple, no mass, no nipple discharge, no skin change and no tenderness. Left breast exhibits no inverted nipple, no mass, no nipple discharge, no skin change and no tenderness. Breasts are symmetrical.  Abdominal: Soft. Normal aorta and bowel sounds are normal. She exhibits no mass. There is no hepatosplenomegaly. There is no tenderness. There is no guarding and no CVA tenderness.  Genitourinary: Rectal exam shows no external hemorrhoid and guaiac negative stool.  Musculoskeletal: Normal range of motion. She exhibits no edema.       Cervical back: Normal.  Thoracic back: Normal.       Lumbar back: Normal.  Lymphadenopathy:       Head (right side): No submental and no submandibular adenopathy present.       Head (left side): No submental and no submandibular adenopathy present.    She has no cervical adenopathy.    She has no axillary adenopathy.  Neurological: She is alert. She has normal motor skills, normal sensation, normal strength, normal reflexes and intact cranial nerves.  Skin: Skin is warm, dry and intact. She is not diaphoretic.  Psychiatric: Mood and affect normal.      Assessment & Plan  Problem List Items Addressed This Visit      Cardiovascular and Mediastinum   Essential hypertension   Relevant Medications   valsartan (DIOVAN) 80 MG tablet   Other Relevant Orders   Renal Function Panel   Lipid Profile     Other   Recurrent major depressive disorder, in partial remission (HCC)   Relevant Medications   sertraline (ZOLOFT) 50 MG tablet   Reactive  depression   Relevant Medications   sertraline (ZOLOFT) 50 MG tablet   Colon cancer screening   Relevant Orders   Ambulatory referral to Gastroenterology   POCT occult blood stool (Completed)   Class 2 obesity with serious comorbidity and body mass index (BMI) of 39.0 to 39.9 in adult   Relevant Medications   INVOKANA 300 MG TABS tablet   Other Relevant Orders   Lipid Profile    Other Visit Diagnoses    Annual physical exam    -  Primary   Relevant Orders   Lipid Profile   Breast cancer screening       Need for pneumococcal vaccination       Breast cancer screening by mammogram       Relevant Orders   MM Digital Screening      Meds ordered this encounter  Medications  . valsartan (DIOVAN) 80 MG tablet    Sig: TAKE 1 TABLET DAILY.       SCHEDULE APPOINTMENT    Dispense:  90 tablet    Refill:  2  . sertraline (ZOLOFT) 50 MG tablet    Sig: Take 1 tablet (50 mg total) by mouth daily.    Dispense:  90 tablet    Refill:  2    Fill this one-  qday # 90      Dr. Elizabeth Sauer [facility]  04/15/17

## 2017-04-16 LAB — RENAL FUNCTION PANEL
Albumin: 3.9 g/dL (ref 3.5–5.5)
BUN / CREAT RATIO: 18 (ref 9–23)
BUN: 13 mg/dL (ref 6–24)
CO2: 24 mmol/L (ref 18–29)
CREATININE: 0.72 mg/dL (ref 0.57–1.00)
Calcium: 9.5 mg/dL (ref 8.7–10.2)
Chloride: 102 mmol/L (ref 96–106)
GFR, EST AFRICAN AMERICAN: 113 mL/min/{1.73_m2} (ref >59)
GFR, EST NON AFRICAN AMERICAN: 98 mL/min/{1.73_m2} (ref >59)
GLUCOSE: 104 mg/dL — AB (ref 65–99)
PHOSPHORUS: 3.5 mg/dL (ref 2.5–4.5)
POTASSIUM: 4.7 mmol/L (ref 3.5–5.2)
SODIUM: 141 mmol/L (ref 134–144)

## 2017-04-16 LAB — LIPID PANEL
CHOL/HDL RATIO: 2.9 ratio (ref 0.0–4.4)
Cholesterol, Total: 131 mg/dL (ref 100–199)
HDL: 45 mg/dL (ref >39)
LDL CALC: 67 mg/dL (ref 0–99)
Triglycerides: 93 mg/dL (ref 0–149)
VLDL Cholesterol Cal: 19 mg/dL (ref 5–40)

## 2017-04-17 ENCOUNTER — Telehealth: Payer: Self-pay | Admitting: Gastroenterology

## 2017-04-17 NOTE — Telephone Encounter (Signed)
Patient left a voice message that she is returning your call regarding an appt for some tests

## 2017-04-18 ENCOUNTER — Other Ambulatory Visit: Payer: Self-pay

## 2017-04-18 DIAGNOSIS — Z1211 Encounter for screening for malignant neoplasm of colon: Secondary | ICD-10-CM

## 2017-04-18 NOTE — Telephone Encounter (Signed)
Gastroenterology Pre-Procedure Review  Request Date: 5/18 Requesting Physician: Dr. Servando Snare  PATIENT REVIEW QUESTIONS: The patient responded to the following health history questions as indicated:    1. Are you having any GI issues? no 2. Do you have a personal history of Polyps? no 3. Do you have a family history of Colon Cancer or Polyps? no 4. Diabetes Mellitus? yes (Type II) 5. Joint replacements in the past 12 months?no 6. Major health problems in the past 3 months?no 7. Any artificial heart valves, MVP, or defibrillator?no    MEDICATIONS & ALLERGIES:    Patient reports the following regarding taking any anticoagulation/antiplatelet therapy:   Plavix, Coumadin, Eliquis, Xarelto, Lovenox, Pradaxa, Brilinta, or Effient? no Aspirin? yes ( )  Patient confirms/reports the following medications:  Current Outpatient Prescriptions  Medication Sig Dispense Refill  . aspirin EC 81 MG tablet Take 1 tablet by mouth daily.    . Dulaglutide (TRULICITY) 1.5 MG/0.5ML SOPN Inject into the skin. Dr Renae Fickle    . glucose blood (ONE TOUCH ULTRA TEST) test strip Dr Renae Fickle    . INVOKANA 300 MG TABS tablet Take 1 tablet by mouth daily. Dr Renae Fickle  0  . metFORMIN (GLUCOPHAGE) 1000 MG tablet Take 1,000 mg by mouth 2 (two) times daily with a meal. Dr Renae Fickle    . sertraline (ZOLOFT) 50 MG tablet Take 1 tablet (50 mg total) by mouth daily. 90 tablet 2  . topiramate (TOPAMAX) 100 MG tablet Take 1 tablet by mouth at bedtime. Dr Renae Fickle    . valsartan (DIOVAN) 80 MG tablet TAKE 1 TABLET DAILY.       SCHEDULE APPOINTMENT 90 tablet 2  . vitamin B-12 (CYANOCOBALAMIN) 1000 MCG tablet Take 1,000 mcg by mouth daily.     No current facility-administered medications for this visit.     Patient confirms/reports the following allergies:  No Known Allergies  No orders of the defined types were placed in this encounter.   AUTHORIZATION INFORMATION Primary Insurance: 1D#: Group #:  Secondary Insurance: 1D#: Group  #:  SCHEDULE INFORMATION: Date: 5/18 Time: Location: [facility]

## 2017-05-07 ENCOUNTER — Telehealth: Payer: Self-pay | Admitting: Gastroenterology

## 2017-05-07 NOTE — Discharge Instructions (Signed)

## 2017-05-07 NOTE — Telephone Encounter (Signed)
Patient called and is frustrated. She hasn't received any prep information or kit. Her procedure is Ludwig Clarks 05/10/17.

## 2017-05-08 ENCOUNTER — Encounter: Payer: Self-pay | Admitting: Family Medicine

## 2017-05-08 ENCOUNTER — Ambulatory Visit (INDEPENDENT_AMBULATORY_CARE_PROVIDER_SITE_OTHER): Payer: Managed Care, Other (non HMO) | Admitting: Family Medicine

## 2017-05-08 ENCOUNTER — Other Ambulatory Visit: Payer: Self-pay

## 2017-05-08 VITALS — BP 130/80 | HR 64 | Temp 98.0°F | Ht 60.0 in | Wt 198.0 lb

## 2017-05-08 DIAGNOSIS — L739 Follicular disorder, unspecified: Secondary | ICD-10-CM | POA: Diagnosis not present

## 2017-05-08 DIAGNOSIS — R21 Rash and other nonspecific skin eruption: Secondary | ICD-10-CM

## 2017-05-08 DIAGNOSIS — Z1211 Encounter for screening for malignant neoplasm of colon: Secondary | ICD-10-CM

## 2017-05-08 MED ORDER — DOXYCYCLINE HYCLATE 100 MG PO TABS: 100.0000 mg | ORAL_TABLET | Freq: Two times a day (BID) | ORAL | 0 refills | Status: DC

## 2017-05-08 MED ORDER — MUPIROCIN 2 % EX OINT: 1.0000 "application " | TOPICAL_OINTMENT | Freq: Two times a day (BID) | CUTANEOUS | 0 refills | Status: DC

## 2017-05-08 MED ORDER — NA SULFATE-K SULFATE-MG SULF 17.5-3.13-1.6 GM/177ML PO SOLN: 1.0000 | Freq: Once | ORAL | 0 refills | Status: AC

## 2017-05-08 NOTE — Progress Notes (Signed)
Name: Holly Foley   MRN: [id number]    DOB: 1966/10/20   Date:05/08/2017       Progress Note  Subjective  Chief Complaint  Chief Complaint  Patient presents with  . Rash    noticed place on stomach x 6 days ago- red dots around area has spread to the rest of belly and around to back and legs and arms. Itches VERY Itchy and gets worse when hot.    Rash  This is a new problem. The current episode started in the past 7 days. The problem has been gradually worsening since onset. The rash is diffuse. The rash is characterized by itchiness. She was exposed to an insect bite/sting (pustular that was palpated/ purulence). Pertinent negatives include no anorexia, congestion, cough, diarrhea, eye pain, facial edema, fatigue, fever, joint pain, nail changes, rhinorrhea, shortness of breath, sore throat or vomiting. Past treatments include nothing.    No problem-specific Assessment & Plan notes found for this encounter.   Past Medical History:  Diagnosis Date  . Depression   . Diabetes mellitus without complication ([facility])   . Hypertension     Past Surgical History:  Procedure Laterality Date  . ABLATION    . CHOLECYSTECTOMY    . HERNIA REPAIR    . PILONIDAL CYST EXCISION      Family History  Problem Relation Age of Onset  . Breast cancer Neg Hx     Social History   Social History  . Marital status: Unknown    Spouse name: N/A  . Number of children: N/A  . Years of education: N/A   Occupational History  . Not on file.   Social History Main Topics  . Smoking status: Former Research scientist (life sciences)  . Smokeless tobacco: Never Used  . Alcohol use 0.0 oz/week     Comment: occasionally  . Drug use: No  . Sexual activity: Not on file   Other Topics Concern  . Not on file   Social History Narrative  . No narrative on file    No Known Allergies  Outpatient Medications Prior to Visit  Medication Sig Dispense Refill  . aspirin EC 81 MG tablet Take 1 tablet by mouth daily.    . BD PEN  NEEDLE NANO U/F 32G X 4 MM MISC   0  . Dulaglutide (TRULICITY) 1.5 LM/7.8ML SOPN Inject into the skin. Dr Eddie Dibbles    . glucose blood (ONE TOUCH ULTRA TEST) test strip Dr Eddie Dibbles    . Insulin Pen Needle (BD PEN NEEDLE NANO U/F) 32G X 4 MM MISC USE 2 PEN NEEDLES DAILY WITH INSULIN INJECTION  (UNBREAKABLE PACKAGE)    . INVOKANA 300 MG TABS tablet Take 1 tablet by mouth daily. Dr Eddie Dibbles  0  . metFORMIN (GLUCOPHAGE) 1000 MG tablet Take 1,000 mg by mouth 2 (two) times daily with a meal. Dr Eddie Dibbles    . Na Sulfate-K Sulfate-Mg Sulf 17.5-3.13-1.6 GM/180ML SOLN Take 1 kit by mouth once. 354 mL 0  . sertraline (ZOLOFT) 50 MG tablet Take 1 tablet (50 mg total) by mouth daily. 90 tablet 2  . topiramate (TOPAMAX) 100 MG tablet   0  . valsartan (DIOVAN) 80 MG tablet TAKE 1 TABLET DAILY.       SCHEDULE APPOINTMENT 90 tablet 2  . vitamin B-12 (CYANOCOBALAMIN) 1000 MCG tablet Take 1,000 mcg by mouth daily.    . metFORMIN (GLUCOPHAGE-XR) 500 MG 24 hr tablet   0  . topiramate (TOPAMAX) 100 MG tablet Take 1 tablet  by mouth at bedtime. Dr Eddie Dibbles     No facility-administered medications prior to visit.     Review of Systems  Constitutional: Negative for chills, fatigue, fever, malaise/fatigue and weight loss.  HENT: Negative for congestion, ear discharge, ear pain, rhinorrhea and sore throat.   Eyes: Negative for blurred vision and pain.  Respiratory: Negative for cough, sputum production, shortness of breath and wheezing.   Cardiovascular: Negative for chest pain, palpitations and leg swelling.  Gastrointestinal: Negative for abdominal pain, anorexia, blood in stool, constipation, diarrhea, heartburn, melena, nausea and vomiting.  Genitourinary: Negative for dysuria, frequency, hematuria and urgency.  Musculoskeletal: Negative for back pain, joint pain, myalgias and neck pain.  Skin: Positive for itching and rash. Negative for nail changes.  Neurological: Negative for dizziness, tingling, sensory change, focal weakness and  headaches.  Endo/Heme/Allergies: Negative for environmental allergies and polydipsia. Does not bruise/bleed easily.  Psychiatric/Behavioral: Negative for depression and suicidal ideas. The patient is not nervous/anxious and does not have insomnia.      Objective  Vitals:   05/08/17 1041  BP: 130/80  Pulse: 64  Temp: 98 F (36.7 C)  Weight: 198 lb (89.8 kg)  Height: 5' (1.524 m)    Physical Exam  Constitutional: She is well-developed, well-nourished, and in no distress. No distress.  HENT:  Head: Normocephalic and atraumatic.  Right Ear: External ear normal.  Left Ear: External ear normal.  Nose: Nose normal.  Mouth/Throat: Oropharynx is clear and moist.  Eyes: Conjunctivae and EOM are normal. Pupils are equal, round, and reactive to light. Right eye exhibits no discharge. Left eye exhibits no discharge.  Neck: Normal range of motion. Neck supple. No JVD present. No thyromegaly present.  Cardiovascular: Normal rate, regular rhythm, normal heart sounds and intact distal pulses.  Exam reveals no gallop and no friction rub.   No murmur heard. Pulmonary/Chest: Effort normal and breath sounds normal. She has no wheezes. She has no rales.  Abdominal: Soft. Bowel sounds are normal. She exhibits no mass. There is no tenderness. There is no guarding.  Musculoskeletal: Normal range of motion. She exhibits no edema.  Lymphadenopathy:    She has no cervical adenopathy.  Neurological: She is alert. She has normal reflexes.  Skin: Skin is warm and dry. She is not diaphoretic.  Psychiatric: Mood and affect normal.  Nursing note and vitals reviewed.     Assessment & Plan  Problem List Items Addressed This Visit    None    Visit Diagnoses    Rash of entire body    -  Primary   Relevant Medications   doxycycline (VIBRA-TABS) 100 MG tablet   Other Relevant Orders   CBC   Rocky mtn spotted fvr abs pnl(IgG+IgM)   Folliculitis       Relevant Medications   doxycycline (VIBRA-TABS)  100 MG tablet   mupirocin ointment (BACTROBAN) 2 %      Meds ordered this encounter  Medications  . doxycycline (VIBRA-TABS) 100 MG tablet    Sig: Take 1 tablet (100 mg total) by mouth 2 (two) times daily.    Dispense:  20 tablet    Refill:  0  . mupirocin ointment (BACTROBAN) 2 %    Sig: Place 1 application into the nose 2 (two) times daily.    Dispense:  22 g    Refill:  0      Dr. Mac[facility]  05/08/17

## 2017-05-08 NOTE — Patient Instructions (Signed)
Rocky [place] Spotted Fever [place] spotted fever is an illness that is spread to people by infected ticks. The illness causes flulike symptoms and a reddish-purple rash. This illness can quickly become very serious. Treatment must be started right away. When the illness is not treated right away, it can sometimes lead to long-term health problems or even death. This illness is most common during warm weather when ticks are most active. What are the causes? [place] spotted fever is caused by a type of bacteria that is called Rickettsia rickettsii. This type of bacteria is carried by American dog ticks and [place] wood ticks. People get infected through a bite from a tick that is infected with the bacteria. The bite is painless, and it frequently goes unnoticed. The bacteria can also infect a person when tick blood or tick feces get into a person's body through damaged skin. A tick bite is not necessary for an infection to occur. People can get [place] spotted fever if they get a tick's blood or body fluids on their skin in the area of a small cut or sore. This could happen while removing a tick from another person or a dog. The infection is not contagious, and it cannot be spread (transmitted) from person to person. What are the signs or symptoms? Symptoms may begin 2-14 days after a tick bite. The most common early symptoms are:  Fever.  Muscle aches.  Headache.  Nausea.  Vomiting.  Poor appetite.  Abdominal pain. The reddish-purple rash usually appears 3-5 days after the first symptoms begin. The rash often starts on the wrists and ankles. It may then spread to the palms, the soles of the feet, the legs, and the trunk. How is this diagnosed? Diagnosis is based on a physical exam, medical history, and blood tests. Your health care provider may suspect [place] spotted fever in one of these cases:  If you have recently been bitten by a tick.  If you have  been in areas that have a lot of ticks or in areas where the disease is common. How is this treated? It is important to begin treatment right away. Treatment will usually involve the use of antibiotic medicines. In some cases, your health care provider may begin treatment before the diagnosis is confirmed. If your symptoms are severe, a hospital stay may be needed. Follow these instructions at home:  Rest as much as possible until you feel better.  Take medicines only as directed by your health care provider.  Take your antibiotic medicine as directed by your health care provider. Finish the antibiotic even if you start to feel better.  Drink enough fluid to keep your urine clear or pale yellow.  Keep all follow-up visits as directed by your health care provider. This is important. How is this prevented? Avoiding tick bites can help to prevent this illness. Take these steps to avoid tick bites when you are outdoors:  Be aware that most ticks live in shrubs, low tree branches, and grassy areas. A tick can climb onto your body when you make contact with leaves or grass where the tick is waiting.  Wear protective clothing. Long sleeves and long pants are best.  Wear white clothes so you can see ticks more easily.  Tuck your pant legs into your socks.  If you go walking on a trail, stay in the middle of the trail to avoid brushing against bushes.  Avoid walking through areas that have long grass.    Put insect repellent on all exposed skin and along boot tops, pant legs, and sleeve cuffs.  Check clothing, hair, and skin repeatedly and before going inside.  Check family members and pets for ticks.  Brush off any ticks that are not attached.  Take a shower or a bath as soon as possible after you have been outdoors. Check your skin for ticks. The most common places on the body where ticks attach themselves are the scalp, neck, armpits, waist, and groin. You can also greatly reduce your  chances of getting [place] spotted fever if you remove attached ticks as soon as possible. To remove an attached tick, use a forceps or fine-point tweezers to detach the intact tick without leaving its mouth parts in the skin. The wound from the tick bite should be washed after the tick has been removed. Contact a health care provider if:  You have drainage, swelling, or increased redness or pain in the area of the rash. Get help right away if:  You have chest pain.  You have shortness of breath.  You have a severe headache.  You have a seizure.  You have severe abdominal pain.  You are feeling confused.  You are bruising easily.  You have bleeding from your gums.  You have blood in your stool. This information is not intended to replace advice given to you by your health care provider. Make sure you discuss any questions you have with your health care provider. Document Released: 03/24/2001 Document Revised: 05/17/2016 Document Reviewed: 07/26/2014 Elsevier Interactive Patient Education  2017 Elsevier Inc.  

## 2017-05-10 ENCOUNTER — Other Ambulatory Visit: Payer: Self-pay

## 2017-05-10 ENCOUNTER — Ambulatory Visit: Payer: Managed Care, Other (non HMO) | Admitting: Anesthesiology

## 2017-05-10 ENCOUNTER — Encounter
Admission: RE | Disposition: A | Discharge status: Home / Self Care | Payer: Self-pay | Source: Ambulatory Visit | Attending: Gastroenterology

## 2017-05-10 ENCOUNTER — Ambulatory Visit
Admission: RE | Admit: 2017-05-10 | Discharge: 2017-05-10 | Disposition: A | Discharge status: Home / Self Care | Payer: Managed Care, Other (non HMO) | Source: Ambulatory Visit | Attending: Gastroenterology | Admitting: Gastroenterology

## 2017-05-10 DIAGNOSIS — I10 Essential (primary) hypertension: Secondary | ICD-10-CM | POA: Diagnosis not present

## 2017-05-10 DIAGNOSIS — K640 First degree hemorrhoids: Secondary | ICD-10-CM | POA: Insufficient documentation

## 2017-05-10 DIAGNOSIS — Z794 Long term (current) use of insulin: Secondary | ICD-10-CM | POA: Insufficient documentation

## 2017-05-10 DIAGNOSIS — Z7982 Long term (current) use of aspirin: Secondary | ICD-10-CM | POA: Diagnosis not present

## 2017-05-10 DIAGNOSIS — Z79899 Other long term (current) drug therapy: Secondary | ICD-10-CM | POA: Insufficient documentation

## 2017-05-10 DIAGNOSIS — Z87891 Personal history of nicotine dependence: Secondary | ICD-10-CM | POA: Diagnosis not present

## 2017-05-10 DIAGNOSIS — E119 Type 2 diabetes mellitus without complications: Secondary | ICD-10-CM | POA: Diagnosis not present

## 2017-05-10 DIAGNOSIS — F329 Major depressive disorder, single episode, unspecified: Secondary | ICD-10-CM | POA: Insufficient documentation

## 2017-05-10 DIAGNOSIS — Z1211 Encounter for screening for malignant neoplasm of colon: Secondary | ICD-10-CM

## 2017-05-10 HISTORY — PX: COLONOSCOPY WITH PROPOFOL: SHX5780

## 2017-05-10 LAB — CBC
Hematocrit: 43.1 % (ref 34.0–46.6)
Hemoglobin: 13.8 g/dL (ref 11.1–15.9)
MCH: 27 pg (ref 26.6–33.0)
MCHC: 32 g/dL (ref 31.5–35.7)
MCV: 84 fL (ref 79–97)
PLATELETS: 258 10*3/uL (ref 150–379)
RBC: 5.11 x10E6/uL (ref 3.77–5.28)
RDW: 14.8 % (ref 12.3–15.4)
WBC: 11 10*3/uL — ABNORMAL HIGH (ref 3.4–10.8)

## 2017-05-10 LAB — HM COLONOSCOPY

## 2017-05-10 LAB — ROCKY MTN SPOTTED FVR ABS PNL(IGG+IGM)
RMSF IgG: NEGATIVE
RMSF IgM: 0.37 index (ref 0.00–0.89)

## 2017-05-10 LAB — GLUCOSE, CAPILLARY
Glucose-Capillary: 128 mg/dL — ABNORMAL HIGH (ref 65–99)
Glucose-Capillary: 101 mg/dL — ABNORMAL HIGH (ref 65–99)

## 2017-05-10 SURGERY — COLONOSCOPY WITH PROPOFOL
Anesthesia: Monitor Anesthesia Care | Wound class: Contaminated

## 2017-05-10 MED ORDER — PROPOFOL 10 MG/ML IV BOLUS
INTRAVENOUS | Status: DC | PRN
  Administered 2017-05-10 (×3): 30 mg via INTRAVENOUS
  Administered 2017-05-10: 20 mg via INTRAVENOUS
  Administered 2017-05-10 (×2): 30 mg via INTRAVENOUS
  Administered 2017-05-10 (×4): 20 mg via INTRAVENOUS
  Administered 2017-05-10: 80 mg via INTRAVENOUS

## 2017-05-10 MED ORDER — LACTATED RINGERS IV SOLN
INTRAVENOUS | Status: DC
  Administered 2017-05-10 (×2): via INTRAVENOUS

## 2017-05-10 MED ORDER — STERILE WATER FOR IRRIGATION IR SOLN
Status: DC | PRN
  Administered 2017-05-10: 08:00:00

## 2017-05-10 MED ORDER — LIDOCAINE HCL (CARDIAC) 20 MG/ML IV SOLN
INTRAVENOUS | Status: DC | PRN
  Administered 2017-05-10: 50 mg via INTRAVENOUS

## 2017-05-10 SURGICAL SUPPLY — 23 items

## 2017-05-10 NOTE — Op Note (Signed)
[facility] Gastroenterology Patient Name: Holly Foley Procedure Date: 05/10/2017 7:39 AM MRN: [id number] Account #: [id number] Date of Birth: Jun 29, 1966 Admit Type: Outpatient Age: 51 Room: [facility] OR ROOM 01 Gender: Female Note Status: Finalized Procedure:            Colonoscopy Indications:          Screening for colorectal malignant neoplasm Providers:            Midge Minium MD, MD Referring MD:         Duanne Limerick, MD (Referring MD) Medicines:            Propofol per Anesthesia Complications:        No immediate complications. Procedure:            Pre-Anesthesia Assessment:                       - Prior to the procedure, a History and Physical was                        performed, and patient medications and allergies were                        reviewed. The patient's tolerance of previous                        anesthesia was also reviewed. The risks and benefits of                        the procedure and the sedation options and risks were                        discussed with the patient. All questions were                        answered, and informed consent was obtained. Prior                        Anticoagulants: The patient has taken no previous                        anticoagulant or antiplatelet agents. ASA Grade                        Assessment: II - A patient with mild systemic disease.                        After reviewing the risks and benefits, the patient was                        deemed in satisfactory condition to undergo the                        procedure.                       After obtaining informed consent, the colonoscope was                        passed under direct vision. Throughout the procedure,  the patient's blood pressure, pulse, and oxygen                        saturations were monitored continuously. The Olympus                        Colonoscope [id number]) was introduced through the                        anus and advanced to the the cecum, identified by                        appendiceal orifice and ileocecal valve. The                        colonoscopy was performed without difficulty. The                        patient tolerated the procedure well. The quality of                        the bowel preparation was excellent. Findings:      The perianal and digital rectal examinations were normal.      Non-bleeding internal hemorrhoids were found during retroflexion. The       hemorrhoids were Grade I (internal hemorrhoids that do not prolapse). Impression:           - Non-bleeding internal hemorrhoids.                       - No specimens collected. Recommendation:       - Discharge patient to home.                       - Resume previous diet.                       - Continue present medications.                       - Repeat colonoscopy in 10 years for screening unless                        any change in family history or lower GI problems. Procedure Code(s):    --- Professional ---                       [phone] [id number], Colonoscopy, flexible; diagnostic, including                        collection of specimen(s) by brushing or washing, when                        performed (separate procedure) Diagnosis Code(s):    --- Professional ---                       Z12.11, Encounter for screening for malignant neoplasm                        of colon CPT copyright 2016 American Medical Association. All rights reserved. The codes documented in this report are preliminary and upon coder  review may  be revised to meet current compliance requirements. Midge Minium MD, MD 05/10/2017 8:26:20 AM This report has been signed electronically. Number of Addenda: 0 Note Initiated On: 05/10/2017 7:39 AM Scope Withdrawal Time: 0 hours 7 minutes 7 seconds  Total Procedure Duration: 0 hours 10 minutes 17 seconds       [facility]

## 2017-05-10 NOTE — Transfer of Care (Signed)
Immediate Anesthesia Transfer of Care Note  Patient: Holly Foley  Procedure(s) Performed: Procedure(s): COLONOSCOPY WITH PROPOFOL (N/A)  Patient Location: PACU  Anesthesia Type: MAC  Level of Consciousness: awake, alert  and patient cooperative  Airway and Oxygen Therapy: Patient Spontanous Breathing and Patient connected to supplemental oxygen  Post-op Assessment: Post-op Vital signs reviewed, Patient's Cardiovascular Status Stable, Respiratory Function Stable, Patent Airway and No signs of Nausea or vomiting  Post-op Vital Signs: Reviewed and stable  Complications: No apparent anesthesia complications

## 2017-05-10 NOTE — Anesthesia Preprocedure Evaluation (Signed)
Anesthesia Evaluation  Patient identified by MRN, date of birth, ID band Patient awake    Reviewed: Allergy & Precautions, H&P , NPO status , Patient's Chart, lab work & pertinent test results  Airway Mallampati: II  TM Distance: >3 FB Neck ROM: full    Dental no notable dental hx.    Pulmonary former smoker,    Pulmonary exam normal        Cardiovascular hypertension, Normal cardiovascular exam     Neuro/Psych    GI/Hepatic   Endo/Other  diabetes  Renal/GU      Musculoskeletal   Abdominal   Peds  Hematology   Anesthesia Other Findings   Reproductive/Obstetrics                             Anesthesia Physical Anesthesia Plan  ASA: II  Anesthesia Plan: MAC   Post-op Pain Management:    Induction:   Airway Management Planned:   Additional Equipment:   Intra-op Plan:   Post-operative Plan:   Informed Consent: I have reviewed the patients History and Physical, chart, labs and discussed the procedure including the risks, benefits and alternatives for the proposed anesthesia with the patient or authorized representative who has indicated his/her understanding and acceptance.     Plan Discussed with:   Anesthesia Plan Comments:         Anesthesia Quick Evaluation  

## 2017-05-10 NOTE — Anesthesia Procedure Notes (Signed)
Procedure Name: MAC Performed by: Mayme Genta Pre-anesthesia Checklist: Patient identified, Emergency Drugs available, Suction available, Timeout performed and Patient being monitored Patient Re-evaluated:Patient Re-evaluated prior to inductionOxygen Delivery Method: Nasal cannula Placement Confirmation: positive ETCO2

## 2017-05-10 NOTE — H&P (Signed)
Holly Miniumarren Johan Creveling, MD [facility] [address] [place], [place] [address] Phone: [phone] Fax : [phone]  Primary Care Physician:  Holly LimerickJones, Deanna C, MD Primary Gastroenterologist:  Dr. Servando Foley  Pre-Procedure History & Physical: HPI:  Holly Foley is a 51 y.o. female is here for a screening colonoscopy.   Past Medical History:  Diagnosis Date  . Depression   . Diabetes mellitus without complication ([facility])   . Hypertension     Past Surgical History:  Procedure Laterality Date  . ABLATION    . CHOLECYSTECTOMY    . HERNIA REPAIR    . PILONIDAL CYST EXCISION      Prior to Admission medications   Medication Sig Start Date End Date Taking? Authorizing Provider  aspirin EC 81 MG tablet Take 1 tablet by mouth daily.   Yes [provider]  BD PEN NEEDLE NANO U/F 32G X 4 MM MISC  01/14/17  Yes [provider]  doxycycline (VIBRA-TABS) 100 MG tablet Take 1 tablet (100 mg total) by mouth 2 (two) times daily. 05/08/17  Yes Holly LimerickJones, Deanna C, MD  Dulaglutide (TRULICITY) 1.5 MG/0.5ML SOPN Inject into the skin. Dr Renae FicklePaul 01/25/16  Yes [provider]  glucose blood (ONE TOUCH ULTRA TEST) test strip Dr Renae FicklePaul 08/09/14  Yes [provider]  Insulin Pen Needle (BD PEN NEEDLE NANO U/F) 32G X 4 MM MISC USE 2 PEN NEEDLES DAILY WITH INSULIN INJECTION  (UNBREAKABLE PACKAGE) 09/24/16  Yes [provider]  INVOKANA 300 MG TABS tablet Take 1 tablet by mouth daily. Dr Renae Ficklepaul 03/14/17  Yes [provider]  metFORMIN (GLUCOPHAGE) 1000 MG tablet Take 1,000 mg by mouth 2 (two) times daily with a meal. Dr Renae FicklePaul   Yes [provider]  sertraline (ZOLOFT) 50 MG tablet Take 1 tablet (50 mg total) by mouth daily. 04/15/17  Yes Holly LimerickJones, Deanna C, MD  topiramate (TOPAMAX) 100 MG tablet Take 1 tablet by mouth at bedtime. Dr Renae FicklePaul 09/13/15 05/01/17 Yes [provider]  topiramate (TOPAMAX) 100 MG tablet  03/14/17  Yes [provider]  valsartan (DIOVAN) 80  MG tablet TAKE 1 TABLET DAILY.       SCHEDULE APPOINTMENT 04/15/17  Yes Holly LimerickJones, Deanna C, MD  vitamin B-12 (CYANOCOBALAMIN) 1000 MCG tablet Take 1,000 mcg by mouth daily.   Yes [provider]  mupirocin ointment (BACTROBAN) 2 % Place 1 application into the nose 2 (two) times daily. 05/08/17   Holly LimerickJones, Deanna C, MD    Allergies as of 04/18/2017  . (No Known Allergies)    Family History  Problem Relation Age of Onset  . Breast cancer Neg Hx     Social History   Social History  . Marital status: Unknown    Spouse name: N/A  . Number of children: N/A  . Years of education: N/A   Occupational History  . Not on file.   Social History Main Topics  . Smoking status: Former Games developermoker  . Smokeless tobacco: Never Used  . Alcohol use 0.0 oz/week     Comment: occasionally  . Drug use: No  . Sexual activity: Not on file   Other Topics Concern  . Not on file   Social History Narrative  . No narrative on file    Review of Systems: See HPI, otherwise negative ROS  Physical Exam: BP 115/75   Pulse 78   Temp 98.1 F (36.7 Foley) (Temporal)   Ht 5' (1.524 m)   Wt 195 lb (88.5 kg)  SpO2 98%   BMI 38.08 kg/m  General:   Alert,  pleasant and cooperative in NAD Head:  Normocephalic and atraumatic. Neck:  Supple; no masses or thyromegaly. Lungs:  Clear throughout to auscultation.    Heart:  Regular rate and rhythm. Abdomen:  Soft, nontender and nondistended. Normal bowel sounds, without guarding, and without rebound.   Neurologic:  Alert and  oriented x4;  grossly normal neurologically.  Impression/Plan: Holly Foley is now here to undergo a screening colonoscopy.  Risks, benefits, and alternatives regarding colonoscopy have been reviewed with the patient.  Questions have been answered.  All parties agreeable.

## 2017-05-10 NOTE — Anesthesia Postprocedure Evaluation (Signed)
Anesthesia Post Note  Patient: Holly Foley  Procedure(s) Performed: Procedure(s) (LRB): COLONOSCOPY WITH PROPOFOL (N/A)  Patient location during evaluation: PACU Anesthesia Type: MAC Level of consciousness: awake and alert and oriented Pain management: satisfactory to patient Vital Signs Assessment: post-procedure vital signs reviewed and stable Respiratory status: spontaneous breathing, nonlabored ventilation and respiratory function stable Cardiovascular status: blood pressure returned to baseline and stable Postop Assessment: Adequate PO intake and No signs of nausea or vomiting Anesthetic complications: no    Raliegh Ip

## 2017-05-13 ENCOUNTER — Ambulatory Visit: Admission: RE | Admit: 2017-05-13 | Payer: Managed Care, Other (non HMO) | Source: Ambulatory Visit

## 2017-05-13 ENCOUNTER — Encounter: Payer: Self-pay | Admitting: Gastroenterology

## 2017-05-15 ENCOUNTER — Ambulatory Visit
Admission: RE | Admit: 2017-05-15 | Discharge: 2017-05-15 | Disposition: A | Discharge status: Home / Self Care | Payer: Managed Care, Other (non HMO) | Source: Ambulatory Visit | Attending: Family Medicine | Admitting: Family Medicine

## 2017-05-15 DIAGNOSIS — Z1231 Encounter for screening mammogram for malignant neoplasm of breast: Secondary | ICD-10-CM | POA: Diagnosis not present

## 2017-08-12 ENCOUNTER — Other Ambulatory Visit: Payer: Self-pay

## 2017-08-12 MED ORDER — LOSARTAN POTASSIUM 100 MG PO TABS: 100.0000 mg | ORAL_TABLET | Freq: Every day | ORAL | 1 refills | Status: DC

## 2017-10-09 ENCOUNTER — Other Ambulatory Visit: Payer: Self-pay | Admitting: Family Medicine

## 2017-11-07 ENCOUNTER — Other Ambulatory Visit: Payer: Self-pay | Admitting: Family Medicine

## 2017-12-02 ENCOUNTER — Other Ambulatory Visit: Payer: Self-pay | Admitting: Family Medicine

## 2017-12-03 ENCOUNTER — Encounter: Payer: Self-pay | Admitting: Family Medicine

## 2017-12-03 ENCOUNTER — Ambulatory Visit (INDEPENDENT_AMBULATORY_CARE_PROVIDER_SITE_OTHER): Payer: Managed Care, Other (non HMO) | Admitting: Family Medicine

## 2017-12-03 VITALS — BP 120/80 | HR 80 | Ht 60.0 in | Wt 194.0 lb

## 2017-12-03 DIAGNOSIS — L270 Generalized skin eruption due to drugs and medicaments taken internally: Secondary | ICD-10-CM | POA: Diagnosis not present

## 2017-12-03 MED ORDER — HYDROXYZINE HCL 10 MG PO TABS: 10.0000 mg | ORAL_TABLET | Freq: Three times a day (TID) | ORAL | 0 refills | Status: DC | PRN

## 2017-12-03 NOTE — Patient Instructions (Signed)
Drug Rash A drug rash is a change in the color or texture of the skin that is caused by a drug. It can develop minutes, hours, or days after the person takes the drug. What are the causes? This condition is usually caused by a drug allergy. It can also be caused by exposure to sunlight after taking a drug that makes the skin sensitive to light. Drugs that commonly cause rashes include:  Penicillin.  Antibiotic medicines.  Medicines that treat seizures.  Medicines that treat cancer (chemotherapy).  Aspirin and other nonsteroidal anti-inflammatory drugs (NSAIDs).  Injectable dyes that contain iodine.  Insulin.  What are the signs or symptoms? Symptoms of this condition include:  Redness.  Tiny bumps.  Peeling.  Itching.  Itchy welts (hives).  Swelling.  The rash may appear on a small area of skin or all over the body. How is this diagnosed? To diagnose the condition, your health care provider will do a physical exam. He or she may also order tests to find out which drug caused the rash. Tests to find the cause of a rash include:  Skin tests.  Blood tests.  Drug challenge. For this test, you stop taking all of the drugs that you do not need to take, and then you start taking them again by adding back one of the drugs at a time.  How is this treated? A drug rash may be treated with medicines, including:  Antihistamines. These may be given to relieve itching.  An NSAID. This may be given to reduce swelling and treat pain.  A steroid drug. This may be given to reduce swelling.  The rash usually goes away when the person stops taking the drug that caused it. Follow these instructions at home:  Take medicines only as directed by your health care provider.  Let all of your health care providers know about any drug reactions you have had in the past.  If you have hives, take a cool shower or use a cool compress to relieve itchiness. Contact a health care provider  if:  You have a fever.  Your rash is not going away.  Your rash gets worse.  Your rash comes back.  You have wheezing or coughing. Get help right away if:  You start to have breathing problems.  You start to have shortness of breath.  You face or throat starts to swell.  You have severe weakness with dizziness or fainting.  You have chest pain. This information is not intended to replace advice given to you by your health care provider. Make sure you discuss any questions you have with your health care provider. Document Released: 01/17/2005 Document Revised: 05/17/2016 Document Reviewed: 10/06/2014 Elsevier Interactive Patient Education  2018 [employer] Inc[employer]  

## 2017-12-03 NOTE — Progress Notes (Signed)
Name: Holly Foley   MRN: [id number]    DOB: 12-20-1966   Date:12/03/2017       Progress Note  Luciana AxeSubjective  Chief Complaint  Chief Complaint  Patient presents with  . Rash    small red dots all over body from knees up    Rash  This is a new problem. The current episode started in the past 7 days (6 days ago). The problem has been gradually worsening since onset. The rash is diffuse. The rash is characterized by itchiness and redness. Associated with: last new med ivocana. Associated symptoms include fatigue. Pertinent negatives include no cough, diarrhea, fever, joint pain, shortness of breath or sore throat. Past treatments include topical steroids. The treatment provided mild relief.    No problem-specific Assessment & Plan notes found for this encounter.   Past Medical History:  Diagnosis Date  . Depression   . Diabetes mellitus without complication (HCC)   . Hypertension     Past Surgical History:  Procedure Laterality Date  . ABLATION    . CHOLECYSTECTOMY    . COLONOSCOPY WITH PROPOFOL N/A 05/10/2017   Procedure: COLONOSCOPY WITH PROPOFOL;  Surgeon: Midge MiniumWohl, Darren, MD;  [facility];  Service: Endoscopy;  Laterality: N/A;  . HERNIA REPAIR    . PILONIDAL CYST EXCISION      Family History  Problem Relation Age of Onset  . Breast cancer Neg Hx     Social History   Socioeconomic History  . Marital status: Unknown    Spouse name: Not on file  . Number of children: Not on file  . Years of education: Not on file  . Highest education level: Not on file  Social Needs  . Financial resource strain: Not on file  . Food insecurity - worry: Not on file  . Food insecurity - inability: Not on file  . Transportation needs - medical: Not on file  . Transportation needs - non-medical: Not on file  Occupational History  . Not on file  Tobacco Use  . Smoking status: Former Games developermoker  . Smokeless tobacco: Never Used  Substance and Sexual Activity  . Alcohol  use: Yes    Alcohol/week: 0.0 oz    Comment: occasionally  . Drug use: No  . Sexual activity: Not on file  Other Topics Concern  . Not on file  Social History Narrative  . Not on file    No Known Allergies  Outpatient Medications Prior to Visit  Medication Sig Dispense Refill  . aspirin EC 81 MG tablet Take 1 tablet by mouth daily.    . BD PEN NEEDLE NANO U/F 32G X 4 MM MISC   0  . glucose blood (ONE TOUCH ULTRA TEST) test strip Dr Renae FicklePaul    . Insulin Pen Needle (BD PEN NEEDLE NANO U/F) 32G X 4 MM MISC USE 2 PEN NEEDLES DAILY WITH INSULIN INJECTION  (UNBREAKABLE PACKAGE)    . losartan (COZAAR) 100 MG tablet TAKE 1 TABLET BY MOUTH EVERY DAY 30 tablet 0  . metFORMIN (GLUCOPHAGE) 1000 MG tablet Take 1,000 mg by mouth 2 (two) times daily with a meal. Dr Renae FicklePaul    . sertraline (ZOLOFT) 50 MG tablet Take 1 tablet (50 mg total) by mouth daily. 90 tablet 2  . vitamin B-12 (CYANOCOBALAMIN) 1000 MCG tablet Take 1,000 mcg by mouth daily.    . INVOKANA 300 MG TABS tablet Take 1 tablet by mouth daily. Dr Renae Ficklepaul  0  . mupirocin ointment (BACTROBAN) 2 %  Place 1 application into the nose 2 (two) times daily. 22 g 0  . doxycycline (VIBRA-TABS) 100 MG tablet Take 1 tablet (100 mg total) by mouth 2 (two) times daily. 20 tablet 0  . Dulaglutide (TRULICITY) 1.5 MG/0.5ML SOPN Inject into the skin. Dr Renae FicklePaul    . topiramate (TOPAMAX) 100 MG tablet Take 1 tablet by mouth at bedtime. Dr Renae FicklePaul    . topiramate (TOPAMAX) 100 MG tablet   0   No facility-administered medications prior to visit.     Review of Systems  Constitutional: Positive for fatigue. Negative for chills, fever, malaise/fatigue and weight loss.  HENT: Negative for ear discharge, ear pain and sore throat.   Eyes: Negative for blurred vision.  Respiratory: Negative for cough, sputum production, shortness of breath and wheezing.   Cardiovascular: Negative for chest pain, palpitations and leg swelling.  Gastrointestinal: Negative for abdominal pain,  blood in stool, constipation, diarrhea, heartburn, melena and nausea.  Genitourinary: Negative for dysuria, frequency, hematuria and urgency.  Musculoskeletal: Negative for back pain, joint pain, myalgias and neck pain.  Skin: Positive for rash.  Neurological: Negative for dizziness, tingling, sensory change, focal weakness and headaches.  Endo/Heme/Allergies: Negative for environmental allergies and polydipsia. Does not bruise/bleed easily.  Psychiatric/Behavioral: Negative for depression and suicidal ideas. The patient is not nervous/anxious and does not have insomnia.      Objective  Vitals:   12/03/17 1106  BP: 120/80  Pulse: 80  Weight: 194 lb (88 kg)  Height: 5' (1.524 m)    Physical Exam  Constitutional: She is well-developed, well-nourished, and in no distress. No distress.  HENT:  Head: Normocephalic and atraumatic.  Right Ear: External ear normal.  Left Ear: External ear normal.  Nose: Nose normal.  Mouth/Throat: Oropharynx is clear and moist.  Eyes: Conjunctivae and EOM are normal. Pupils are equal, round, and reactive to light. Right eye exhibits no discharge. Left eye exhibits no discharge.  Neck: Normal range of motion. Neck supple. No JVD present. No thyromegaly present.  Cardiovascular: Normal rate, regular rhythm, normal heart sounds and intact distal pulses. Exam reveals no gallop and no friction rub.  No murmur heard. Pulmonary/Chest: Effort normal and breath sounds normal.  Abdominal: Soft. Bowel sounds are normal. She exhibits no mass. There is no tenderness. There is no guarding.  Musculoskeletal: Normal range of motion. She exhibits no edema.  Lymphadenopathy:    She has no cervical adenopathy.  Neurological: She is alert. She has normal reflexes.  Skin: Skin is warm and dry. Rash noted. Rash is maculopapular. She is not diaphoretic.  Psychiatric: Mood and affect normal.  Nursing note and vitals reviewed.     Assessment & Plan  Problem List Items  Addressed This Visit    None    Visit Diagnoses    Allergic drug rash    -  Primary   hold losartin/d/c invocana      Meds ordered this encounter  Medications  . hydrOXYzine (ATARAX/VISTARIL) 10 MG tablet    Sig: Take 1 tablet (10 mg total) by mouth 3 (three) times daily as needed.    Dispense:  30 tablet    Refill:  0      Dr. Hayden Rasmusseneanna Amaziah Raisanen [facility]  12/03/17

## 2017-12-11 ENCOUNTER — Ambulatory Visit: Payer: Managed Care, Other (non HMO) | Admitting: Family Medicine

## 2017-12-13 ENCOUNTER — Ambulatory Visit (INDEPENDENT_AMBULATORY_CARE_PROVIDER_SITE_OTHER): Payer: Managed Care, Other (non HMO) | Admitting: Family Medicine

## 2017-12-13 ENCOUNTER — Encounter: Payer: Self-pay | Admitting: Family Medicine

## 2017-12-13 VITALS — BP 120/64 | HR 60 | Ht 60.0 in | Wt 200.0 lb

## 2017-12-13 DIAGNOSIS — E119 Type 2 diabetes mellitus without complications: Secondary | ICD-10-CM | POA: Diagnosis not present

## 2017-12-13 DIAGNOSIS — L509 Urticaria, unspecified: Secondary | ICD-10-CM

## 2017-12-13 DIAGNOSIS — F329 Major depressive disorder, single episode, unspecified: Secondary | ICD-10-CM | POA: Diagnosis not present

## 2017-12-13 MED ORDER — SERTRALINE HCL 50 MG PO TABS: 50.0000 mg | ORAL_TABLET | Freq: Every day | ORAL | 2 refills | Status: DC

## 2017-12-13 MED ORDER — HYDROXYZINE HCL 10 MG PO TABS: 10.0000 mg | ORAL_TABLET | Freq: Three times a day (TID) | ORAL | 0 refills | Status: DC | PRN

## 2017-12-13 NOTE — Progress Notes (Signed)
Name: Holly Foley   MRN: [id number]    DOB: 10-Aug-1966   Date:12/13/2017       Progress Note  Subjective  Chief Complaint  Chief Complaint  Patient presents with  . Follow-up    rash- looks better but can still see it. Has not itched until today    Rash  This is a new problem. The current episode started 1 to 4 weeks ago. The problem has been gradually improving since onset. The rash is diffuse. The rash is characterized by redness and itchiness. Associated with: manipulating medication. Pertinent negatives include no anorexia, congestion, cough, diarrhea, eye pain, facial edema, fatigue, fever, joint pain, nail changes, rhinorrhea, shortness of breath, sore throat or vomiting. Past treatments include nothing. The treatment provided moderate relief.  Depression         This is a chronic problem.  The current episode started more than 1 year ago.   The onset quality is gradual.   The problem has been gradually improving since onset.  Associated symptoms include no decreased concentration, no fatigue, no helplessness, no hopelessness, does not have insomnia, not irritable, no decreased interest, no myalgias, no headaches, no indigestion, not sad and no suicidal ideas.  Past treatments include SSRIs - Selective serotonin reuptake inhibitors.  Compliance with treatment is good.  Previous treatment provided mild relief.   No problem-specific Assessment & Plan notes found for this encounter.   Past Medical History:  Diagnosis Date  . Depression   . Diabetes mellitus without complication (HCC)   . Hypertension     Past Surgical History:  Procedure Laterality Date  . ABLATION    . CHOLECYSTECTOMY    . COLONOSCOPY WITH PROPOFOL N/A 05/10/2017   Procedure: COLONOSCOPY WITH PROPOFOL;  Surgeon: Midge MiniumWohl, Darren, MD;  [facility];  Service: Endoscopy;  Laterality: N/A;  . HERNIA REPAIR    . PILONIDAL CYST EXCISION      Family History  Problem Relation Age of Onset  . Breast  cancer Neg Hx     Social History   Socioeconomic History  . Marital status: Unknown    Spouse name: Not on file  . Number of children: Not on file  . Years of education: Not on file  . Highest education level: Not on file  Social Needs  . Financial resource strain: Not on file  . Food insecurity - worry: Not on file  . Food insecurity - inability: Not on file  . Transportation needs - medical: Not on file  . Transportation needs - non-medical: Not on file  Occupational History  . Not on file  Tobacco Use  . Smoking status: Former Games developermoker  . Smokeless tobacco: Never Used  Substance and Sexual Activity  . Alcohol use: Yes    Alcohol/week: 0.0 oz    Comment: occasionally  . Drug use: No  . Sexual activity: Not on file  Other Topics Concern  . Not on file  Social History Narrative  . Not on file    No Known Allergies  Outpatient Medications Prior to Visit  Medication Sig Dispense Refill  . aspirin EC 81 MG tablet Take 1 tablet by mouth daily.    . BD PEN NEEDLE NANO U/F 32G X 4 MM MISC   0  . canagliflozin (INVOKANA) 300 MG TABS tablet Take 0.5 tablets by mouth daily. Dr Renae FicklePaul    . glucose blood (ONE TOUCH ULTRA TEST) test strip Dr Renae FicklePaul    . Insulin Pen Needle (BD  PEN NEEDLE NANO U/F) 32G X 4 MM MISC USE 2 PEN NEEDLES DAILY WITH INSULIN INJECTION  (UNBREAKABLE PACKAGE)    . vitamin B-12 (CYANOCOBALAMIN) 1000 MCG tablet Take 1,000 mcg by mouth daily.    . hydrOXYzine (ATARAX/VISTARIL) 10 MG tablet Take 1 tablet (10 mg total) by mouth 3 (three) times daily as needed. 30 tablet 0  . losartan (COZAAR) 100 MG tablet TAKE 1 TABLET BY MOUTH EVERY DAY 30 tablet 0  . metFORMIN (GLUCOPHAGE) 1000 MG tablet Take 1,000 mg by mouth 2 (two) times daily with a meal. Dr Renae Fickle    . sertraline (ZOLOFT) 50 MG tablet Take 1 tablet (50 mg total) by mouth daily. 90 tablet 2   No facility-administered medications prior to visit.     Review of Systems  Constitutional: Negative for chills,  fatigue, fever, malaise/fatigue and weight loss.  HENT: Negative for congestion, ear discharge, ear pain, rhinorrhea and sore throat.   Eyes: Negative for blurred vision and pain.  Respiratory: Negative for cough, sputum production, shortness of breath and wheezing.   Cardiovascular: Negative for chest pain, palpitations and leg swelling.  Gastrointestinal: Negative for abdominal pain, anorexia, blood in stool, constipation, diarrhea, heartburn, melena, nausea and vomiting.  Genitourinary: Negative for dysuria, frequency, hematuria and urgency.  Musculoskeletal: Negative for back pain, joint pain, myalgias and neck pain.  Skin: Positive for rash. Negative for nail changes.  Neurological: Negative for dizziness, tingling, sensory change, focal weakness and headaches.  Endo/Heme/Allergies: Negative for environmental allergies and polydipsia. Does not bruise/bleed easily.  Psychiatric/Behavioral: Negative for decreased concentration, depression and suicidal ideas. The patient is not nervous/anxious and does not have insomnia.      Objective  Vitals:   12/13/17 1003  BP: 120/64  Pulse: 60  Weight: 200 lb (90.7 kg)  Height: 5' (1.524 m)    Physical Exam  Constitutional: She is well-developed, well-nourished, and in no distress. She is not irritable. No distress.  HENT:  Head: Normocephalic and atraumatic.  Right Ear: External ear normal.  Left Ear: External ear normal.  Nose: Nose normal.  Mouth/Throat: Oropharynx is clear and moist.  Eyes: Conjunctivae and EOM are normal. Pupils are equal, round, and reactive to light. Right eye exhibits no discharge. Left eye exhibits no discharge.  Neck: Normal range of motion. Neck supple. No JVD present. No thyromegaly present.  Cardiovascular: Normal rate, regular rhythm, normal heart sounds and intact distal pulses. Exam reveals no gallop and no friction rub.  No murmur heard. Pulmonary/Chest: Effort normal and breath sounds normal. She has no  wheezes. She has no rales.  Abdominal: Soft. Bowel sounds are normal. She exhibits no mass. There is no tenderness. There is no guarding.  Musculoskeletal: Normal range of motion. She exhibits no edema.  Lymphadenopathy:    She has no cervical adenopathy.  Neurological: She is alert. She has normal reflexes.  Skin: Skin is warm and dry. She is not diaphoretic.  Psychiatric: Mood and affect normal.  Nursing note and vitals reviewed.     Assessment & Plan  Problem List Items Addressed This Visit      Endocrine   Type 2 diabetes mellitus (HCC) - Primary   Relevant Medications   canagliflozin (INVOKANA) 300 MG TABS tablet     Other   Reactive depression   Relevant Medications   sertraline (ZOLOFT) 50 MG tablet   hydrOXYzine (ATARAX/VISTARIL) 10 MG tablet    Other Visit Diagnoses    Urticaria       zyrtec  AM/atarax PM      Meds ordered this encounter  Medications  . sertraline (ZOLOFT) 50 MG tablet    Sig: Take 1 tablet (50 mg total) by mouth daily.    Dispense:  90 tablet    Refill:  2    Fill this one- 50mg  qday # 90  . hydrOXYzine (ATARAX/VISTARIL) 10 MG tablet    Sig: Take 1 tablet (10 mg total) by mouth 3 (three) times daily as needed.    Dispense:  30 tablet    Refill:  0      Dr. Hayden Rasmusseneanna Jones [facility]  12/13/17

## 2018-03-19 IMAGING — MG MM DIGITAL SCREENING BILAT W/ CAD
5 series · 5 of 5 positions shown · non-contrast
Comparison: Previous exam(s).

CLINICAL DATA: Screening.

EXAM:
DIGITAL SCREENING BILATERAL MAMMOGRAM WITH CAD

[R CC (1 of 2)]
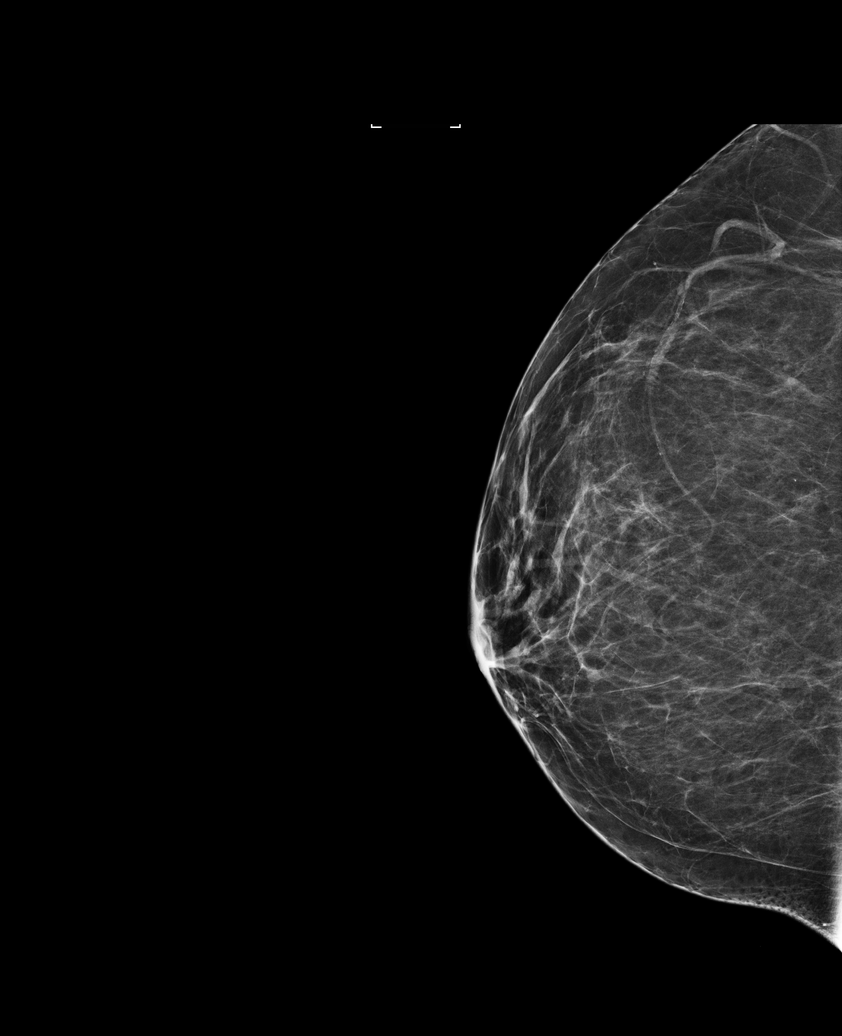

[R CC (2 of 2)]
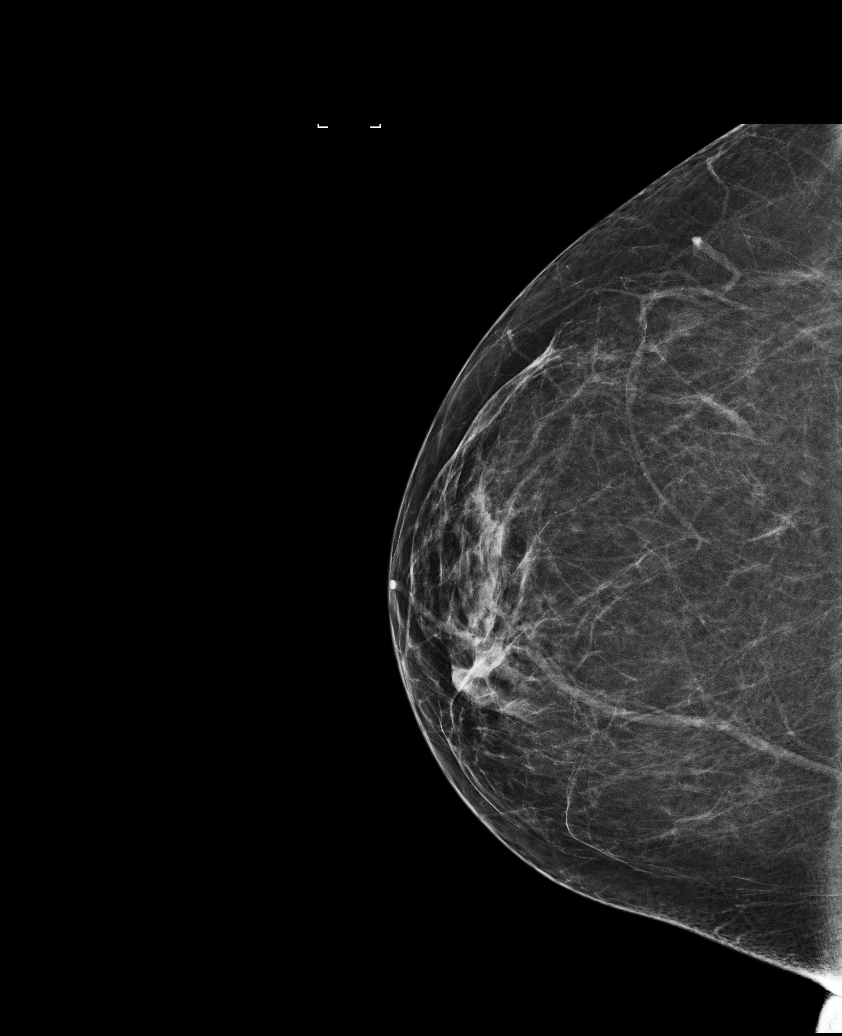

[L MLO]
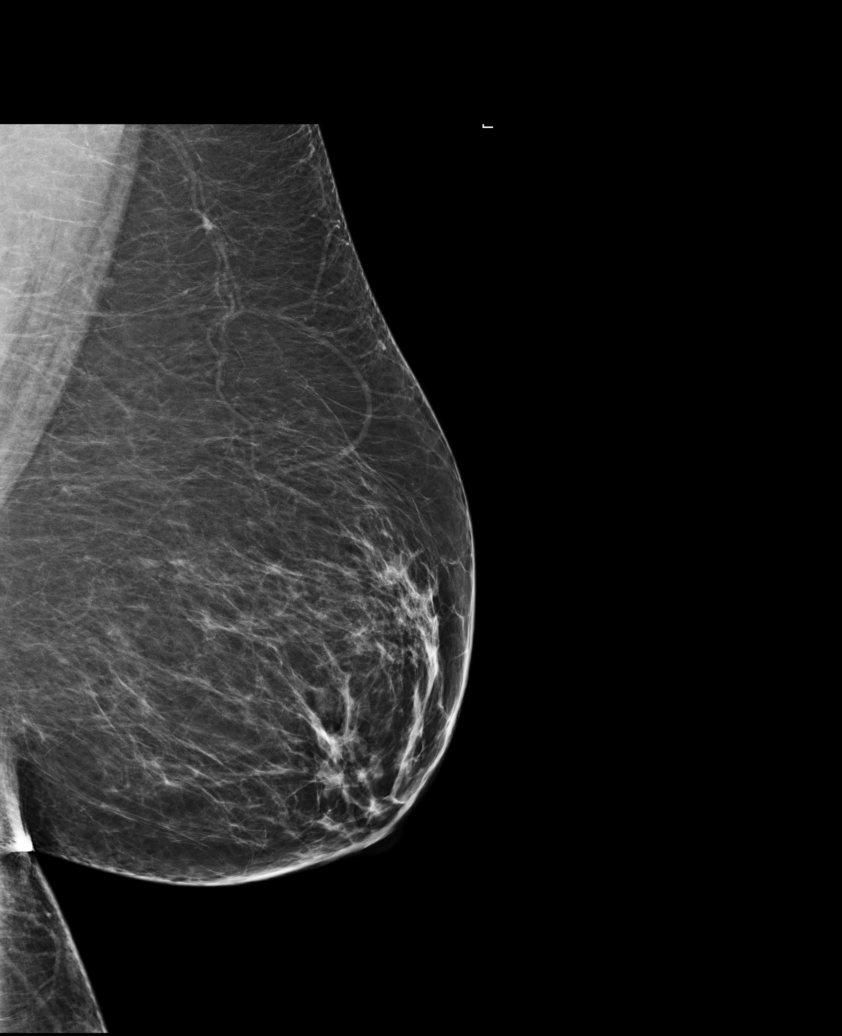

[R MLO]
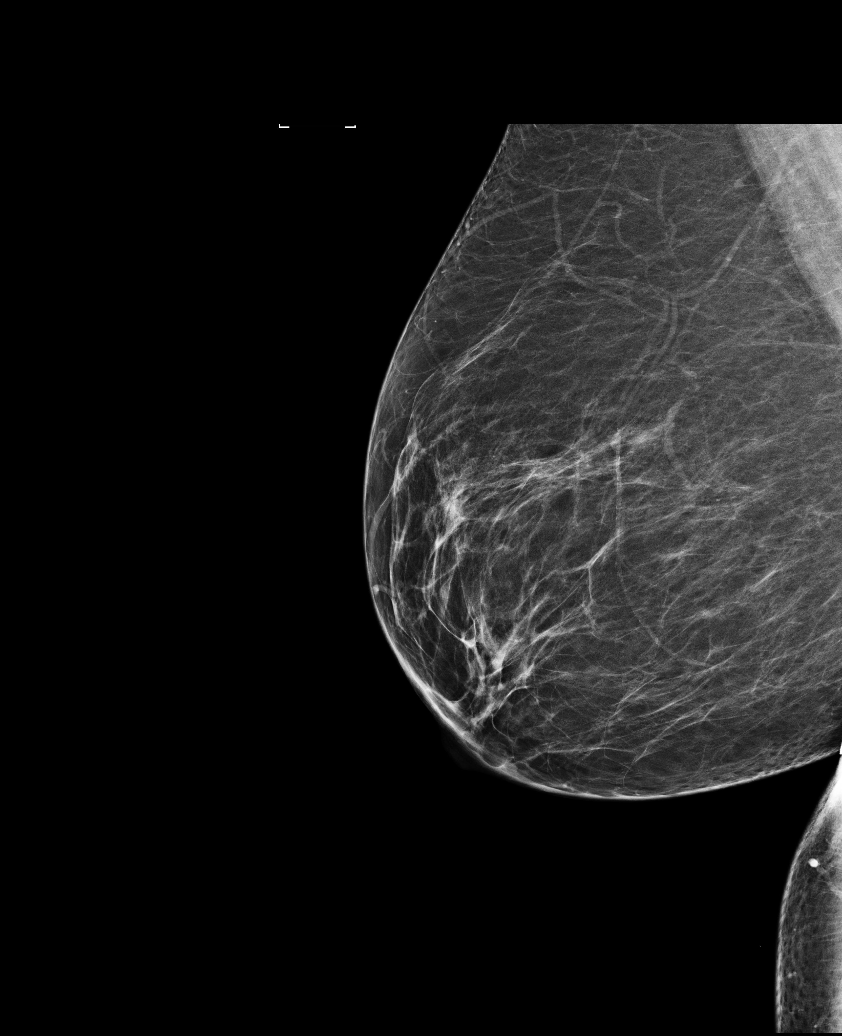

[L CC]
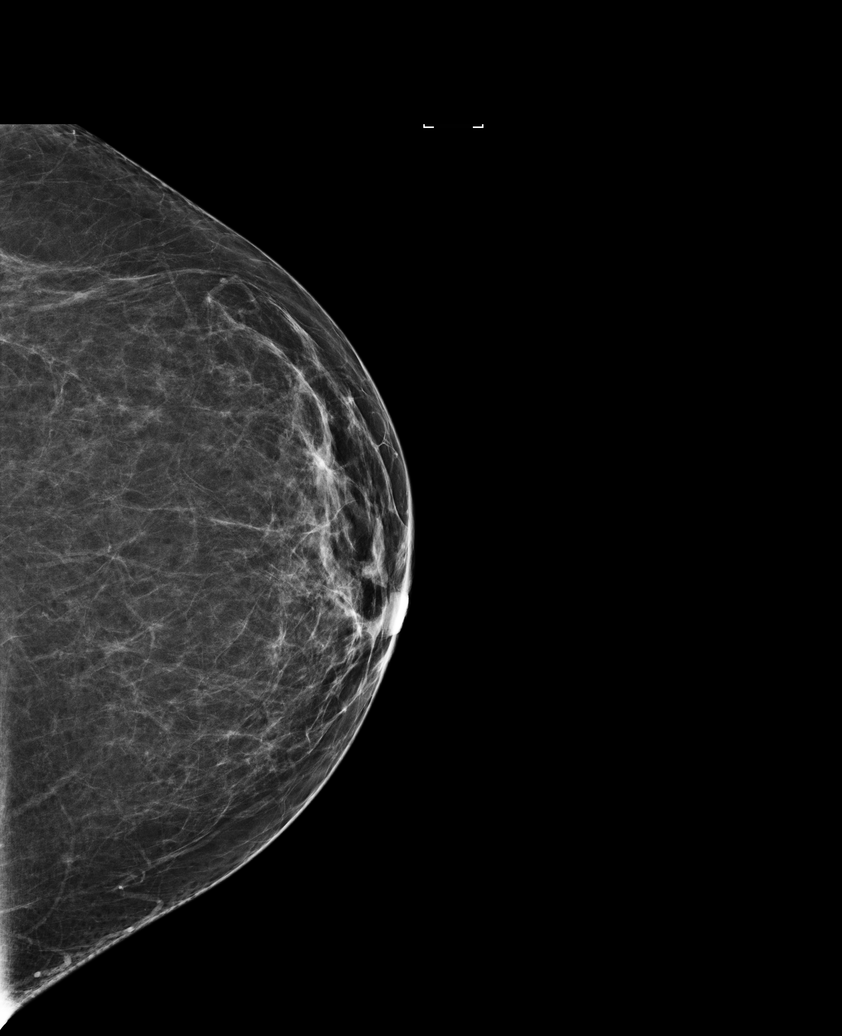

[5 of 5 positions shown; findings below may reference images not displayed]

ACR Breast Density Category b: There are scattered areas of
fibroglandular density.
FINDINGS: There are no findings suspicious for malignancy. Images were
processed with CAD.
IMPRESSION: No mammographic evidence of malignancy. A result letter of this
screening mammogram will be mailed directly to the patient.

RECOMMENDATION:
Screening mammogram in one year. (Code:AS-G-LCT)

BI-RADS CATEGORY  1: Negative.

## 2018-04-30 ENCOUNTER — Ambulatory Visit (INDEPENDENT_AMBULATORY_CARE_PROVIDER_SITE_OTHER): Payer: Managed Care, Other (non HMO) | Admitting: Family Medicine

## 2018-04-30 ENCOUNTER — Encounter: Payer: Self-pay | Admitting: Family Medicine

## 2018-04-30 VITALS — BP 120/80 | HR 64 | Ht 60.0 in | Wt 197.0 lb

## 2018-04-30 DIAGNOSIS — F329 Major depressive disorder, single episode, unspecified: Secondary | ICD-10-CM

## 2018-04-30 DIAGNOSIS — Z23 Encounter for immunization: Secondary | ICD-10-CM | POA: Diagnosis not present

## 2018-04-30 MED ORDER — SERTRALINE HCL 100 MG PO TABS: 100.0000 mg | ORAL_TABLET | Freq: Every day | ORAL | 6 refills | Status: DC

## 2018-04-30 NOTE — Progress Notes (Signed)
Name: Holly Foley   MRN: [id number]    DOB: 03-Nov-1966   Date:04/30/2018       Progress Note  Subjective  Chief Complaint  Chief Complaint  Patient presents with  . Depression    refill sertraline-     Depression         This is a chronic problem.  The current episode started more than 1 year ago.   The onset quality is gradual.   The problem occurs intermittently.  The problem has been waxing and waning since onset.  Associated symptoms include irritable and sad.  Associated symptoms include no decreased concentration, no fatigue, no helplessness, no hopelessness, does not have insomnia, no restlessness, no decreased interest, no appetite change, no body aches, no myalgias, no headaches, no indigestion and no suicidal ideas.     The symptoms are aggravated by work stress.  Past treatments include SSRIs - Selective serotonin reuptake inhibitors.  Compliance with treatment is good.  Previous treatment provided mild relief.   No problem-specific Assessment & Plan notes found for this encounter.   Past Medical History:  Diagnosis Date  . Depression   . Diabetes mellitus without complication (HCC)   . Hypertension     Past Surgical History:  Procedure Laterality Date  . ABLATION    . CHOLECYSTECTOMY    . COLONOSCOPY WITH PROPOFOL N/A 05/10/2017   Procedure: COLONOSCOPY WITH PROPOFOL;  Surgeon: Midge Minium, MD;  Location: [facility];  Service: Endoscopy;  Laterality: N/A;  . HERNIA REPAIR    . PILONIDAL CYST EXCISION      Family History  Problem Relation Age of Onset  . Breast cancer Neg Hx     Social History   Socioeconomic History  . Marital status: Unknown    Spouse name: Not on file  . Number of children: Not on file  . Years of education: Not on file  . Highest education level: Not on file  Occupational History  . Not on file  Social Needs  . Financial resource strain: Not on file  . Food insecurity:    Worry: Not on file    Inability: Not on file  .  Transportation needs:    Medical: Not on file    Non-medical: Not on file  Tobacco Use  . Smoking status: Former Games developer  . Smokeless tobacco: Never Used  Substance and Sexual Activity  . Alcohol use: Yes    Alcohol/week: 0.0 oz    Comment: occasionally  . Drug use: No  . Sexual activity: Not on file  Lifestyle  . Physical activity:    Days per week: Not on file    Minutes per session: Not on file  . Stress: Not on file  Relationships  . Social connections:    Talks on phone: Not on file    Gets together: Not on file    Attends religious service: Not on file    Active member of club or organization: Not on file    Attends meetings of clubs or organizations: Not on file    Relationship status: Not on file  . Intimate partner violence:    Fear of current or ex partner: Not on file    Emotionally abused: Not on file    Physically abused: Not on file    Forced sexual activity: Not on file  Other Topics Concern  . Not on file  Social History Narrative  . Not on file    No Known Allergies  Outpatient Medications Prior to Visit  Medication Sig Dispense Refill  . aspirin EC 81 MG tablet Take 1 tablet by mouth daily.    . BD PEN NEEDLE NANO U/F 32G X 4 MM MISC   0  . Dulaglutide (TRULICITY) 1.5 MG/0.5ML SOPN Inject into the skin.    Marland Kitchen empagliflozin (JARDIANCE) 10 MG TABS tablet Take 1 tablet by mouth daily.    Marland Kitchen glucose blood (ONE TOUCH ULTRA TEST) test strip Dr Renae Fickle    . Insulin Pen Needle (BD PEN NEEDLE NANO U/F) 32G X 4 MM MISC USE 2 PEN NEEDLES DAILY WITH INSULIN INJECTION  (UNBREAKABLE PACKAGE)    . vitamin B-12 (CYANOCOBALAMIN) 1000 MCG tablet Take 1,000 mcg by mouth daily.    . canagliflozin (INVOKANA) 300 MG TABS tablet Take 0.5 tablets by mouth daily. Dr Renae Fickle    . hydrOXYzine (ATARAX/VISTARIL) 10 MG tablet Take 1 tablet (10 mg total) by mouth 3 (three) times daily as needed. 30 tablet 0  . sertraline (ZOLOFT) 50 MG tablet Take 1 tablet (50 mg total) by mouth daily. 90  tablet 2   No facility-administered medications prior to visit.     Review of Systems  Constitutional: Negative for appetite change, chills, fatigue, fever, malaise/fatigue and weight loss.  HENT: Negative for ear discharge, ear pain and sore throat.   Eyes: Negative for blurred vision.  Respiratory: Negative for cough, sputum production, shortness of breath and wheezing.   Cardiovascular: Negative for chest pain, palpitations and leg swelling.  Gastrointestinal: Negative for abdominal pain, blood in stool, constipation, diarrhea, heartburn, melena and nausea.  Genitourinary: Negative for dysuria, frequency, hematuria and urgency.  Musculoskeletal: Negative for back pain, joint pain, myalgias and neck pain.  Skin: Negative for rash.  Neurological: Negative for dizziness, tingling, sensory change, focal weakness and headaches.  Endo/Heme/Allergies: Negative for environmental allergies and polydipsia. Does not bruise/bleed easily.  Psychiatric/Behavioral: Positive for depression. Negative for decreased concentration and suicidal ideas. The patient is not nervous/anxious and does not have insomnia.      Objective  Vitals:   04/30/18 0922  BP: 120/80  Pulse: 64  Weight: 197 lb (89.4 kg)  Height: 5' (1.524 m)    Physical Exam  Constitutional: She is oriented to person, place, and time. She appears well-developed and well-nourished. She is irritable.  HENT:  Head: Normocephalic.  Right Ear: External ear normal.  Left Ear: External ear normal.  Mouth/Throat: Oropharynx is clear and moist.  Eyes: Pupils are equal, round, and reactive to light. Conjunctivae and EOM are normal. Lids are everted and swept, no foreign bodies found. Left eye exhibits no hordeolum. No foreign body present in the left eye. Right conjunctiva is not injected. Left conjunctiva is not injected. No scleral icterus.  Neck: Normal range of motion. Neck supple. No JVD present. No tracheal deviation present. No  thyromegaly present.  Cardiovascular: Normal rate, regular rhythm, normal heart sounds and intact distal pulses. Exam reveals no gallop and no friction rub.  No murmur heard. Pulmonary/Chest: Effort normal and breath sounds normal. No respiratory distress. She has no wheezes. She has no rales.  Abdominal: Soft. Bowel sounds are normal. She exhibits no mass. There is no hepatosplenomegaly. There is no tenderness. There is no rebound and no guarding.  Musculoskeletal: Normal range of motion. She exhibits no edema or tenderness.  Lymphadenopathy:    She has no cervical adenopathy.  Neurological: She is alert and oriented to person, place, and time. She has normal strength. She displays normal  reflexes. No cranial nerve deficit.  Skin: Skin is warm. No rash noted.  Psychiatric: She has a normal mood and affect. Her mood appears not anxious. She does not exhibit a depressed mood.  Nursing note and vitals reviewed.     Assessment & Plan  Problem List Items Addressed This Visit      Other   Reactive depression - Primary   Relevant Medications   sertraline (ZOLOFT) 100 MG tablet    Other Visit Diagnoses    Need for 23-polyvalent pneumococcal polysaccharide vaccine       Relevant Orders   Pneumococcal polysaccharide vaccine 23-valent greater than or equal to 2yo subcutaneous/IM (Completed)      Meds ordered this encounter  Medications  . sertraline (ZOLOFT) 100 MG tablet    Sig: Take 1 tablet (100 mg total) by mouth daily. Pt will take 1 and a half  q day before     Dispense:  30 tablet    Refill:  6      Dr. Elizabeth Sauer [facility]  04/30/18

## 2018-12-25 ENCOUNTER — Other Ambulatory Visit: Payer: Self-pay

## 2018-12-26 ENCOUNTER — Other Ambulatory Visit: Payer: Self-pay | Admitting: Family Medicine

## 2018-12-26 DIAGNOSIS — F329 Major depressive disorder, single episode, unspecified: Secondary | ICD-10-CM

## 2019-01-27 ENCOUNTER — Encounter: Payer: Self-pay | Admitting: Family Medicine

## 2019-01-27 ENCOUNTER — Other Ambulatory Visit: Payer: Self-pay | Admitting: Family Medicine

## 2019-01-27 DIAGNOSIS — F329 Major depressive disorder, single episode, unspecified: Secondary | ICD-10-CM

## 2019-01-27 MED ORDER — SERTRALINE HCL 100 MG PO TABS: 100.0000 mg | ORAL_TABLET | Freq: Every day | ORAL | 0 refills | Status: DC

## 2019-02-05 ENCOUNTER — Encounter: Payer: Self-pay | Admitting: Family Medicine

## 2019-02-05 ENCOUNTER — Ambulatory Visit (INDEPENDENT_AMBULATORY_CARE_PROVIDER_SITE_OTHER): Payer: Managed Care, Other (non HMO) | Admitting: Family Medicine

## 2019-02-05 VITALS — BP 120/80 | HR 68 | Ht 60.0 in | Wt 198.0 lb

## 2019-02-05 DIAGNOSIS — F3341 Major depressive disorder, recurrent, in partial remission: Secondary | ICD-10-CM | POA: Diagnosis not present

## 2019-02-05 MED ORDER — SERTRALINE HCL 100 MG PO TABS: 100.0000 mg | ORAL_TABLET | Freq: Every day | ORAL | 2 refills | Status: DC

## 2019-02-05 NOTE — Progress Notes (Signed)
Date:  02/05/2019   Name:  Holly Foley   DOB:  1966/02/28   MRN:  [id number]   Chief Complaint: Depression (PHQ9=2)  Depression         This is a chronic problem.  The current episode started more than 1 year ago.   The problem has been gradually improving since onset.  Associated symptoms include no decreased concentration, no fatigue, no helplessness, no hopelessness, does not have insomnia, not irritable, no restlessness, no decreased interest, no appetite change, no body aches, no myalgias, no headaches, no indigestion, not sad and no suicidal ideas.     The symptoms are aggravated by nothing.  Past treatments include SSRIs - Selective serotonin reuptake inhibitors.  Compliance with treatment is good.  Previous treatment provided moderate relief.   Review of Systems  Constitutional: Negative.  Negative for appetite change, chills, fatigue, fever and unexpected weight change.  HENT: Negative for congestion, ear discharge, ear pain, rhinorrhea, sinus pressure, sneezing and sore throat.   Eyes: Negative for photophobia, pain, discharge, redness and itching.  Respiratory: Negative for cough, shortness of breath, wheezing and stridor.   Gastrointestinal: Negative for abdominal pain, blood in stool, constipation, diarrhea, nausea and vomiting.  Endocrine: Negative for cold intolerance, heat intolerance, polydipsia, polyphagia and polyuria.  Genitourinary: Negative for dysuria, flank pain, frequency, hematuria, menstrual problem, pelvic pain, urgency, vaginal bleeding and vaginal discharge.  Musculoskeletal: Negative for arthralgias, back pain and myalgias.  Skin: Negative for rash.  Allergic/Immunologic: Negative for environmental allergies and food allergies.  Neurological: Negative for dizziness, weakness, light-headedness, numbness and headaches.  Hematological: Negative for adenopathy. Does not bruise/bleed easily.  Psychiatric/Behavioral: Positive for depression. Negative for  decreased concentration, dysphoric mood and suicidal ideas. The patient is not nervous/anxious and does not have insomnia.     Patient Active Problem List   Diagnosis Date Noted  . Special screening for malignant neoplasms, colon   . Recurrent major depressive disorder, in partial remission (HCC) 04/15/2017  . Reactive depression 04/15/2017  . Colon cancer screening 04/15/2017  . Class 2 obesity with serious comorbidity and body mass index (BMI) of 39.0 to 39.9 in adult 04/15/2017  . Recurrent ventral incisional hernia 07/03/2016  . Periumbilical abdominal pain 06/25/2016  . HLD (hyperlipidemia) 01/31/2016  . Essential hypertension 01/31/2016  . Near syncope 01/31/2016  . Fast heart beat 01/31/2016  . B12 deficiency 08/31/2014  . Diabetes mellitus (HCC) 08/31/2014  . Type 2 diabetes mellitus (HCC) 08/24/2014    No Known Allergies  Past Surgical History:  Procedure Laterality Date  . ABLATION    . CHOLECYSTECTOMY    . COLONOSCOPY WITH PROPOFOL N/A 05/10/2017   Procedure: COLONOSCOPY WITH PROPOFOL;  Surgeon: Midge Minium, MD;  Location: [facility];  Service: Endoscopy;  Laterality: N/A;  . HERNIA REPAIR    . PILONIDAL CYST EXCISION      Social History   Tobacco Use  . Smoking status: Former Games developer  . Smokeless tobacco: Never Used  Substance Use Topics  . Alcohol use: Yes    Alcohol/week: 0.0 standard drinks    Comment: occasionally  . Drug use: No     Medication list has been reviewed and updated.  Current Meds  Medication Sig  . aspirin EC 81 MG tablet Take 1 tablet by mouth daily.  . BD PEN NEEDLE NANO U/F 32G X 4 MM MISC   . Dulaglutide (TRULICITY) 1.5 MG/0.5ML SOPN Inject into the skin.  Marland Kitchen empagliflozin (JARDIANCE) 10 MG TABS tablet  Take 1 tablet by mouth daily.  Marland Kitchen. glimepiride (AMARYL) 2 MG tablet Take 1 tablet by mouth daily. [employer]  . glucose blood (ONE TOUCH ULTRA TEST) test strip Dr Renae FicklePaul  . Insulin Pen Needle (BD PEN NEEDLE NANO U/F)  32G X 4 MM MISC USE 2 PEN NEEDLES DAILY WITH INSULIN INJECTION  (UNBREAKABLE PACKAGE)  . sertraline (ZOLOFT) 100 MG tablet Take 1 tablet (100 mg total) by mouth daily.  . vitamin B-12 (CYANOCOBALAMIN) 1000 MCG tablet Take 1,000 mcg by mouth daily.    PHQ 2/9 Scores 02/05/2019 01/31/2016  PHQ - 2 Score 0 0  PHQ- 9 Score 2 -    Physical Exam Vitals signs and nursing note reviewed.  Constitutional:      General: She is not irritable.She is not in acute distress.    Appearance: She is not diaphoretic.  HENT:     Head: Normocephalic and atraumatic.     Right Ear: External ear normal.     Left Ear: External ear normal.     Nose: Nose normal.  Eyes:     General:        Right eye: No discharge.        Left eye: No discharge.     Conjunctiva/sclera: Conjunctivae normal.     Pupils: Pupils are equal, round, and reactive to light.  Neck:     Musculoskeletal: Normal range of motion and neck supple.     Thyroid: No thyromegaly.     Vascular: No JVD.  Cardiovascular:     Rate and Rhythm: Normal rate and regular rhythm.     Heart sounds: Normal heart sounds. No murmur. No friction rub. No gallop.   Pulmonary:     Effort: Pulmonary effort is normal.     Breath sounds: Normal breath sounds.  Abdominal:     General: Bowel sounds are normal.     Palpations: Abdomen is soft. There is no mass.     Tenderness: There is no abdominal tenderness. There is no guarding.  Musculoskeletal: Normal range of motion.  Lymphadenopathy:     Cervical: No cervical adenopathy.  Skin:    General: Skin is warm and dry.  Neurological:     General: No focal deficit present.     Mental Status: She is alert.     Deep Tendon Reflexes: Reflexes are normal and symmetric.     BP 120/80   Pulse 68   Ht 5' (1.524 m)   Wt 198 lb (89.8 kg)   BMI 38.67 kg/m   Assessment and Plan: 1. Recurrent major depressive disorder, in partial remission (HCC) Chronic. Stable on meds- refill sertraline - sertraline (ZOLOFT)  100 MG tablet; Take 1 tablet (100 mg total) by mouth daily.  Dispense: 90 tablet; Refill: 2

## 2019-04-14 ENCOUNTER — Other Ambulatory Visit: Payer: Self-pay

## 2019-04-14 ENCOUNTER — Ambulatory Visit (INDEPENDENT_AMBULATORY_CARE_PROVIDER_SITE_OTHER): Payer: Managed Care, Other (non HMO) | Admitting: Family Medicine

## 2019-04-14 ENCOUNTER — Encounter: Payer: Self-pay | Admitting: Family Medicine

## 2019-04-14 ENCOUNTER — Other Ambulatory Visit (HOSPITAL_COMMUNITY)
Admission: RE | Admit: 2019-04-14 | Discharge: 2019-04-14 | Disposition: A | Discharge status: Home / Self Care | Payer: Managed Care, Other (non HMO) | Source: Ambulatory Visit | Attending: Family Medicine | Admitting: Family Medicine

## 2019-04-14 VITALS — BP 120/80 | HR 64 | Ht 60.0 in | Wt 201.0 lb

## 2019-04-14 DIAGNOSIS — Z0000 Encounter for general adult medical examination without abnormal findings: Secondary | ICD-10-CM

## 2019-04-14 DIAGNOSIS — Z124 Encounter for screening for malignant neoplasm of cervix: Secondary | ICD-10-CM | POA: Diagnosis not present

## 2019-04-14 DIAGNOSIS — Z1211 Encounter for screening for malignant neoplasm of colon: Secondary | ICD-10-CM

## 2019-04-14 DIAGNOSIS — Z1231 Encounter for screening mammogram for malignant neoplasm of breast: Secondary | ICD-10-CM | POA: Diagnosis not present

## 2019-04-14 LAB — HEMOCCULT GUIAC POC 1CARD (OFFICE): Fecal Occult Blood, POC: NEGATIVE

## 2019-04-14 NOTE — Progress Notes (Signed)
Date:  04/14/2019   Name:  Holly Foley   DOB:  [birth year]   MRN:  [id number]   Chief Complaint: Annual Exam (pap)  Patient is a 53 year old female who presents for a comprehensive physical exam. The patient reports the following problems: melancholy. Health maintenance has been reviewed needs pap.   Review of Systems  Constitutional: Negative.  Negative for chills, fatigue, fever and unexpected weight change.  HENT: Negative for congestion, ear discharge, ear pain, rhinorrhea, sinus pressure, sneezing and sore throat.   Eyes: Negative for photophobia, pain, discharge, redness and itching.  Respiratory: Negative for cough, shortness of breath, wheezing and stridor.   Gastrointestinal: Negative for abdominal pain, blood in stool, constipation, diarrhea, nausea and vomiting.  Endocrine: Negative for cold intolerance, heat intolerance, polydipsia, polyphagia and polyuria.  Genitourinary: Negative for dysuria, flank pain, frequency, hematuria, menstrual problem, pelvic pain, urgency, vaginal bleeding and vaginal discharge.  Musculoskeletal: Negative for arthralgias, back pain and myalgias.  Skin: Negative for rash.  Allergic/Immunologic: Negative for environmental allergies and food allergies.  Neurological: Negative for dizziness, weakness, light-headedness, numbness and headaches.  Hematological: Negative for adenopathy. Does not bruise/bleed easily.  Psychiatric/Behavioral: Negative for dysphoric mood. The patient is not nervous/anxious.     Patient Active Problem List   Diagnosis Date Noted  . Special screening for malignant neoplasms, colon   . Recurrent major depressive disorder, in partial remission (HCC) 04/15/2017  . Reactive depression 04/15/2017  . Colon cancer screening 04/15/2017  . Class 2 obesity with serious comorbidity and body mass index (BMI) of 39.0 to 39.9 in adult 04/15/2017  . Recurrent ventral incisional hernia 07/03/2016  . Periumbilical abdominal pain  06/25/2016  . HLD (hyperlipidemia) 01/31/2016  . Essential hypertension 01/31/2016  . Near syncope 01/31/2016  . Fast heart beat 01/31/2016  . B12 deficiency 08/31/2014  . Diabetes mellitus (HCC) 08/31/2014  . Type 2 diabetes mellitus (HCC) 08/24/2014    No Known Allergies  Past Surgical History:  Procedure Laterality Date  . ABLATION    . CHOLECYSTECTOMY    . COLONOSCOPY WITH PROPOFOL N/A 05/10/2017   Procedure: COLONOSCOPY WITH PROPOFOL;  Surgeon: Midge Minium, MD;  Location: [facility];  Service: Endoscopy;  Laterality: N/A;  . HERNIA REPAIR    . PILONIDAL CYST EXCISION      Social History   Tobacco Use  . Smoking status: Former Games developer  . Smokeless tobacco: Never Used  Substance Use Topics  . Alcohol use: Yes    Alcohol/week: 0.0 standard drinks    Comment: occasionally  . Drug use: No     Medication list has been reviewed and updated.  Current Meds  Medication Sig  . aspirin EC 81 MG tablet Take 1 tablet by mouth daily.  . BD PEN NEEDLE NANO U/F 32G X 4 MM MISC   . Dulaglutide (TRULICITY) 1.5 MG/0.5ML SOPN Inject into the skin.  Marland Kitchen empagliflozin (JARDIANCE) 10 MG TABS tablet Take 1 tablet by mouth daily.  Marland Kitchen glimepiride (AMARYL) 2 MG tablet Take 1 tablet by mouth daily. [employer]  . glucose blood (ONE TOUCH ULTRA TEST) test strip Dr Renae Fickle  . Insulin Pen Needle (BD PEN NEEDLE NANO U/F) 32G X 4 MM MISC USE 2 PEN NEEDLES DAILY WITH INSULIN INJECTION  (UNBREAKABLE PACKAGE)  . sertraline (ZOLOFT) 100 MG tablet Take 1 tablet (100 mg total) by mouth daily.  . vitamin B-12 (CYANOCOBALAMIN) 1000 MCG tablet Take 1,000 mcg by mouth daily.    PHQ 2/9  Scores 02/05/2019 01/31/2016  PHQ - 2 Score 0 0  PHQ- 9 Score 2 -    BP Readings from Last 3 Encounters:  04/14/19 120/80  02/05/19 120/80  04/30/18 120/80    Physical Exam Vitals signs and nursing note reviewed. Exam conducted with a chaperone present.  Constitutional:      General: She is not in acute  distress.    Appearance: She is not diaphoretic.  HENT:     Head: Normocephalic and atraumatic.     Right Ear: Tympanic membrane, ear canal and external ear normal. There is no impacted cerumen.     Left Ear: Tympanic membrane, ear canal and external ear normal. There is no impacted cerumen.     Nose: Nose normal. No congestion or rhinorrhea.     Mouth/Throat:     Mouth: Mucous membranes are moist.  Eyes:     General:        Right eye: No discharge.        Left eye: No discharge.     Conjunctiva/sclera: Conjunctivae normal.     Pupils: Pupils are equal, round, and reactive to light.  Neck:     Musculoskeletal: Normal range of motion and neck supple. No neck rigidity or muscular tenderness.     Thyroid: No thyromegaly.     Vascular: No carotid bruit or JVD.  Cardiovascular:     Rate and Rhythm: Normal rate and regular rhythm.     Heart sounds: Normal heart sounds. No murmur. No friction rub. No gallop.   Pulmonary:     Effort: Pulmonary effort is normal. No respiratory distress.     Breath sounds: Normal breath sounds. No stridor. No decreased breath sounds, wheezing, rhonchi or rales.  Chest:     Chest wall: No tenderness.     Breasts: Breasts are symmetrical.        Right: Normal. No swelling, bleeding, inverted nipple, mass, nipple discharge, skin change or tenderness.        Left: Normal. No swelling, bleeding, inverted nipple, mass, nipple discharge, skin change or tenderness.  Abdominal:     General: Abdomen is flat. Bowel sounds are normal.     Palpations: Abdomen is soft. There is no shifting dullness, fluid wave, hepatomegaly, splenomegaly or mass.     Tenderness: There is no abdominal tenderness. There is no right CVA tenderness, left CVA tenderness or guarding.     Hernia: There is no hernia in the right inguinal area or left inguinal area.  Genitourinary:    General: Normal vulva.     Exam position: Lithotomy position.     Pubic Area: No rash.      Labia:         Right: No rash, tenderness, lesion or injury.        Left: No rash, tenderness, lesion or injury.      Vagina: Normal.     Cervix: Normal.     Uterus: Normal.      Adnexa: Right adnexa normal and left adnexa normal.     Rectum: Normal. Guaiac result negative.  Musculoskeletal: Normal range of motion.  Lymphadenopathy:     Cervical: No cervical adenopathy.     Upper Body:     Right upper body: No supraclavicular or axillary adenopathy.     Left upper body: No supraclavicular or axillary adenopathy.     Lower Body: No right inguinal adenopathy. No left inguinal adenopathy.  Skin:    General: Skin is warm and dry.  Neurological:     Mental Status: She is alert.     Deep Tendon Reflexes: Reflexes are normal and symmetric.     Wt Readings from Last 3 Encounters:  04/14/19 201 lb (91.2 kg)  02/05/19 198 lb (89.8 kg)  04/30/18 197 lb (89.4 kg)    BP 120/80   Pulse 64   Ht 5' (1.524 m)   Wt 201 lb (91.2 kg)   BMI 39.26 kg/m   Assessment and Plan: 1. Annual physical exam No subjective/objective concerns noted during the history and physical of the exam.  Patient's chart was reviewed for previous visit, labs, and imaging.  Patient had venous ablation noted prior to exam. - MM 3D SCREEN BREAST BILATERAL; Future - Cytology - PAP  2. Cervical cancer screening Underwent a normal exam along with a Pap smear and speculum exam.  Exam was noted to be normal and Pap was taken. - Cytology - PAP  3. Breast cancer screening by mammogram Exam was done with no evidence of mass.  Screening mammogram was scheduled. - MM 3D SCREEN BREAST BILATERAL; Future  4. Colon cancer screening Guaiac was noted to be negative and colonoscopy referral was made to gastroenterology. - POCT occult blood stool

## 2019-04-21 LAB — CYTOLOGY - PAP
Adequacy: ABSENT
Diagnosis: NEGATIVE
HPV: NOT DETECTED

## 2019-04-23 ENCOUNTER — Other Ambulatory Visit: Payer: Self-pay

## 2019-04-23 DIAGNOSIS — R87615 Unsatisfactory cytologic smear of cervix: Secondary | ICD-10-CM

## 2019-10-12 ENCOUNTER — Encounter: Payer: Self-pay | Admitting: Family Medicine

## 2019-10-14 ENCOUNTER — Ambulatory Visit (INDEPENDENT_AMBULATORY_CARE_PROVIDER_SITE_OTHER): Payer: Managed Care, Other (non HMO) | Admitting: Family Medicine

## 2019-10-14 ENCOUNTER — Other Ambulatory Visit: Payer: Self-pay

## 2019-10-14 ENCOUNTER — Encounter: Payer: Self-pay | Admitting: Family Medicine

## 2019-10-14 DIAGNOSIS — F3341 Major depressive disorder, recurrent, in partial remission: Secondary | ICD-10-CM

## 2019-10-14 DIAGNOSIS — Z23 Encounter for immunization: Secondary | ICD-10-CM | POA: Diagnosis not present

## 2019-10-14 MED ORDER — SERTRALINE HCL 100 MG PO TABS: 100.0000 mg | ORAL_TABLET | Freq: Every day | ORAL | 2 refills | Status: DC

## 2019-10-14 NOTE — Patient Instructions (Signed)

## 2019-10-14 NOTE — Progress Notes (Signed)
Date:  10/14/2019   Name:  Holly Foley   DOB:  [birth year]   MRN:  [id number]   Chief Complaint: Depression (PHQ9-  12)  Depression        This is a chronic problem.  The current episode started more than 1 year ago. The problem is unchanged.  Associated symptoms include helplessness, hopelessness, irritable, decreased interest and sad.  Associated symptoms include no decreased concentration, no fatigue, does not have insomnia, no restlessness, no appetite change, no body aches, no myalgias, no headaches, no indigestion and no suicidal ideas.     The symptoms are aggravated by work stress.  Past treatments include SSRIs - Selective serotonin reuptake inhibitors.  Compliance with treatment is good.  Previous treatment provided mild relief.   Review of Systems  Constitutional: Negative.  Negative for appetite change, chills, fatigue, fever and unexpected weight change.  HENT: Negative for congestion, ear discharge, ear pain, rhinorrhea, sinus pressure, sneezing and sore throat.   Eyes: Negative for photophobia, pain, discharge, redness and itching.  Respiratory: Negative for cough, shortness of breath, wheezing and stridor.   Gastrointestinal: Negative for abdominal pain, blood in stool, constipation, diarrhea, nausea and vomiting.  Endocrine: Negative for cold intolerance, heat intolerance, polydipsia, polyphagia and polyuria.  Genitourinary: Negative for dysuria, flank pain, frequency, hematuria, menstrual problem, pelvic pain, urgency, vaginal bleeding and vaginal discharge.  Musculoskeletal: Negative for arthralgias, back pain and myalgias.  Skin: Negative for rash.  Allergic/Immunologic: Negative for environmental allergies and food allergies.  Neurological: Negative for dizziness, weakness, light-headedness, numbness and headaches.  Hematological: Negative for adenopathy. Does not bruise/bleed easily.  Psychiatric/Behavioral: Positive for depression. Negative for decreased  concentration, dysphoric mood and suicidal ideas. The patient is not nervous/anxious and does not have insomnia.     Patient Active Problem List   Diagnosis Date Noted  . Special screening for malignant neoplasms, colon   . Recurrent major depressive disorder, in partial remission ([place]) 04/15/2017  . Reactive depression 04/15/2017  . Colon cancer screening 04/15/2017  . Class 2 obesity with serious comorbidity and body mass index (BMI) of 39.0 to 39.9 in adult 04/15/2017  . Recurrent ventral incisional hernia 07/03/2016  . Periumbilical abdominal pain 06/25/2016  . HLD (hyperlipidemia) 01/31/2016  . Essential hypertension 01/31/2016  . Near syncope 01/31/2016  . Fast heart beat 01/31/2016  . B12 deficiency 08/31/2014  . Diabetes mellitus ([place]) 08/31/2014  . Type 2 diabetes mellitus ([place]) 08/24/2014    Allergies  Allergen Reactions  . Canagliflozin Rash    Past Surgical History:  Procedure Laterality Date  . ABLATION    . CHOLECYSTECTOMY    . COLONOSCOPY WITH PROPOFOL N/A 05/10/2017   Procedure: COLONOSCOPY WITH PROPOFOL;  Surgeon: Lucilla Lame, MD;  Location: [facility];  Service: Endoscopy;  Laterality: N/A;  . HERNIA REPAIR    . PILONIDAL CYST EXCISION      Social History   Tobacco Use  . Smoking status: Former Research scientist (life sciences)  . Smokeless tobacco: Never Used  Substance Use Topics  . Alcohol use: Yes    Alcohol/week: 0.0 standard drinks    Comment: occasionally  . Drug use: No     Medication list has been reviewed and updated.  Current Meds  Medication Sig  . aspirin EC 81 MG tablet Take 1 tablet by mouth daily.  . BD PEN NEEDLE NANO U/F 32G X 4 MM MISC   . Dulaglutide (TRULICITY) 1.5 AT/5.5DD SOPN Inject into the skin.  Marland Kitchen empagliflozin (JARDIANCE) 10  MG TABS tablet Take 1 tablet by mouth daily.  Marland Kitchen. glimepiride (AMARYL) 2 MG tablet Take 1 tablet by mouth daily. [employer] Blackwood  . glucose blood (ONE TOUCH ULTRA TEST) test strip Dr Renae FicklePaul  . Insulin Pen Needle  (BD PEN NEEDLE NANO U/F) 32G X 4 MM MISC USE 2 PEN NEEDLES DAILY WITH INSULIN INJECTION  (UNBREAKABLE PACKAGE)  . metFORMIN (GLUMETZA) 500 MG (MOD) 24 hr tablet Take 1,000 mg by mouth 2 (two) times daily with a meal.  . sertraline (ZOLOFT) 100 MG tablet Take 1 tablet (100 mg total) by mouth daily.  . vitamin B-12 (CYANOCOBALAMIN) 1000 MCG tablet Take 1,000 mcg by mouth daily.    PHQ 2/9 Scores 10/14/2019 02/05/2019 01/31/2016  PHQ - 2 Score 4 0 0  PHQ- 9 Score 12 2 -    BP Readings from Last 3 Encounters:  10/14/19 122/82  04/14/19 120/80  02/05/19 120/80    Physical Exam Vitals signs and nursing note reviewed.  Constitutional:      General: She is irritable. She is not in acute distress.    Appearance: She is not diaphoretic.  HENT:     Head: Normocephalic and atraumatic.     Right Ear: Tympanic membrane, ear canal and external ear normal.     Left Ear: Tympanic membrane, ear canal and external ear normal.     Nose: Nose normal. No congestion or rhinorrhea.  Eyes:     General:        Right eye: No discharge.        Left eye: No discharge.     Conjunctiva/sclera: Conjunctivae normal.     Pupils: Pupils are equal, round, and reactive to light.  Neck:     Musculoskeletal: Normal range of motion and neck supple.     Thyroid: No thyromegaly.     Vascular: No JVD.  Cardiovascular:     Rate and Rhythm: Normal rate and regular rhythm.     Heart sounds: Normal heart sounds, S1 normal and S2 normal. No murmur. No systolic murmur. No diastolic murmur. No friction rub. No gallop. No S3 or S4 sounds.   Pulmonary:     Effort: Pulmonary effort is normal.     Breath sounds: Normal breath sounds.  Abdominal:     General: Bowel sounds are normal.     Palpations: Abdomen is soft. There is no mass.     Tenderness: There is no abdominal tenderness. There is no guarding.  Musculoskeletal: Normal range of motion.  Lymphadenopathy:     Cervical: No cervical adenopathy.  Skin:    General:  Skin is warm and dry.  Neurological:     Mental Status: She is alert.     Deep Tendon Reflexes: Reflexes are normal and symmetric.     Wt Readings from Last 3 Encounters:  10/14/19 195 lb (88.5 kg)  04/14/19 201 lb (91.2 kg)  02/05/19 198 lb (89.8 kg)    BP 122/82   Pulse 78   Resp 16   Ht 5' (1.524 m)   Wt 195 lb (88.5 kg)   SpO2 98%   BMI 38.08 kg/m   Assessment and Plan:  1. Recurrent major depressive disorder, in partial remission (HCC) Chronic.  Controlled.  Stable.  PHQ at 12.  Patient has a lot of stress issues and with the relatively recent loss of her spouse and the additional stress of responsibility of general financing and in negotiating her life things have been a bit stressful.  But  patient will continue on the Zoloft 100 mg a day.  We will recheck in 6 to 9 months. - sertraline (ZOLOFT) 100 MG tablet; Take 1 tablet (100 mg total) by mouth daily.  Dispense: 90 tablet; Refill: 2  Obesity: Patient was given a Mediterranean diet for guidelines.

## 2020-01-15 LAB — HEMOGLOBIN A1C: Hemoglobin A1C: 7.5

## 2020-03-14 ENCOUNTER — Other Ambulatory Visit: Payer: Self-pay

## 2020-03-14 NOTE — Progress Notes (Unsigned)
Put in A1C 

## 2020-04-21 LAB — RESULTS CONSOLE HPV: CHL HPV: NEGATIVE

## 2020-04-21 LAB — HM PAP SMEAR: HM Pap smear: NORMAL

## 2020-05-31 ENCOUNTER — Other Ambulatory Visit: Payer: Self-pay | Admitting: Obstetrics and Gynecology

## 2020-05-31 DIAGNOSIS — Z1231 Encounter for screening mammogram for malignant neoplasm of breast: Secondary | ICD-10-CM

## 2020-06-02 ENCOUNTER — Ambulatory Visit
Admission: RE | Admit: 2020-06-02 | Discharge: 2020-06-02 | Disposition: A | Discharge status: Home / Self Care | Payer: Managed Care, Other (non HMO) | Source: Ambulatory Visit | Attending: Obstetrics and Gynecology | Admitting: Obstetrics and Gynecology

## 2020-06-02 ENCOUNTER — Other Ambulatory Visit: Payer: Self-pay

## 2020-06-02 DIAGNOSIS — Z1231 Encounter for screening mammogram for malignant neoplasm of breast: Secondary | ICD-10-CM | POA: Insufficient documentation

## 2020-07-11 ENCOUNTER — Encounter: Payer: Self-pay | Admitting: Family Medicine

## 2020-07-11 ENCOUNTER — Ambulatory Visit (INDEPENDENT_AMBULATORY_CARE_PROVIDER_SITE_OTHER): Payer: Managed Care, Other (non HMO) | Admitting: Family Medicine

## 2020-07-11 ENCOUNTER — Other Ambulatory Visit: Payer: Self-pay

## 2020-07-11 VITALS — BP 120/80 | HR 76 | Ht 60.0 in | Wt 184.0 lb

## 2020-07-11 DIAGNOSIS — F3341 Major depressive disorder, recurrent, in partial remission: Secondary | ICD-10-CM | POA: Diagnosis not present

## 2020-07-11 DIAGNOSIS — F419 Anxiety disorder, unspecified: Secondary | ICD-10-CM | POA: Diagnosis not present

## 2020-07-11 DIAGNOSIS — E119 Type 2 diabetes mellitus without complications: Secondary | ICD-10-CM

## 2020-07-11 DIAGNOSIS — F329 Major depressive disorder, single episode, unspecified: Secondary | ICD-10-CM | POA: Diagnosis not present

## 2020-07-11 MED ORDER — SERTRALINE HCL 100 MG PO TABS: 100.0000 mg | ORAL_TABLET | Freq: Every day | ORAL | 2 refills | Status: DC

## 2020-07-11 MED ORDER — BUSPIRONE HCL 5 MG PO TABS: 5.0000 mg | ORAL_TABLET | Freq: Three times a day (TID) | ORAL | 5 refills | Status: DC

## 2020-07-11 MED ORDER — BUSPIRONE HCL 5 MG PO TABS: 5.0000 mg | ORAL_TABLET | Freq: Two times a day (BID) | ORAL | 5 refills | Status: DC

## 2020-07-11 NOTE — Patient Instructions (Signed)
Agoraphobia Agoraphobia is a mental health disorder in which a person fears going out in public places where he or she may feel helpless, trapped, or embarrassed in the event of a panic attack. People with this condition have a fear of losing control during a panic attack, and they often start to avoid the situations that they fear or insist on having another person go with them. Agoraphobia may interfere with normal daily activities and personal relationships. People with severe agoraphobia may become completely homebound and dependent on others for daily tasks, such as grocery shopping and taking care of errands. Agoraphobia is a type of anxiety. It usually begins before age 35, but it can start in older adult years. People with agoraphobia are at risk for other anxiety disorders, depression, and substance abuse. What are the causes? The cause of this condition is not known. A variety of factors such as fear of sensations and emotions in anxiety (anxiety sensitivity), family history of anxiety (genetics), and stressful events may contribute to this condition. What increases the risk? You are more likely to develop this condition if:  You are a woman.  You have a panic disorder.  You have family members with agoraphobia. What are the signs or symptoms? You may have agoraphobia if you have any of the following symptoms for 6 months or longer:  Intense fear arising from two or more of the following: ? Using public transportation, such as cars, buses, planes, trains, or ships. ? Being in open spaces, such as parking lots, shopping malls, or bridges. ? Being in enclosed spaces, such as shops, theaters, or elevators. ? Standing in line or being in a crowd. ? Being outside the home alone.  Fear of being unable to escape or get help if feared events occur. These events include: ? Panic attack. ? Loss of bowel control in older adults.  Reacting to feared situations by: ? Avoiding  them. ? Requiring the presence of a companion. ? Enduring them with intense fear or anxiety.  Fear or anxiety that is out of proportion to the actual danger that is posed by the event and the situation. How is this diagnosed? This condition is diagnosed based on:  Your symptoms. You will be asked questions about your fears and how they have affected you.  Your medical history and your use of medicines, alcohol, or drugs.  Physical exam and lab tests. These are usually ordered to rule out other problems that may be causing your symptoms. You may be referred to a mental health specialist (psychiatrist or psychologist). How is this treated? This condition is usually treated using a combination of counseling and medicines.  Counseling or talk therapy. Talk therapy is provided by mental health specialists. The following forms of talk therapy can be especially helpful: ? Cognitive behavioral therapy (CBT). CBT helps you to recognize and change unrealistic thoughts and beliefs that contribute to your fears. You will learn that body changes associated with anxiety (such as increased heart rate and breathing) are completely normal and expected. ? Exposure therapy. This type of therapy helps you to face and overcome your fears in a relaxed state and in a safe environment. Exposures are usually approached in a systematic way, starting with situations that provoke less fear and building up to situations that provoke more intense fear. Exposure therapy includes:  Imagined exposure. You will imagine fearful situations and expose yourself to them in your mind.  In vivo exposure. You will face your fears in the real   world, such as by standing in a crowded place for a few minutes.  Interoceptive exposure. In a safe environment, you will practice experiencing body changes that are associated with panic attacks. One example is breathing through a straw to experience breathlessness.  Medicines. The following  types of medicines may be helpful: ? Antidepressants. These can decrease general levels of anxiety and can help to prevent panic attacks. ? Benzodiazepines. These medicines block feelings of anxiety and panic. ? Beta-blockers. Beta blockers can reduce physical symptoms of anxiety, such as sweating, tremors, and a racing heart. They may help you to feel less tense and anxious. Follow these instructions at home: Lifestyle  Try to exercise. Get 150 or more minutes of physical activity each week. Also aim to do strengthening exercises two or more times a week.  Eat a healthy diet that includes plenty of vegetables, fruits, whole grains, low-fat dairy products, and lean protein. Do not eat a lot of foods that are high in solid fats, added sugars, or salt (sodium).  Get the right amount and quality of sleep. Most adults need 7-9 hours of sleep each night.  Do not drink alcohol.  Do not use illegal drugs. General instructions  Take over-the-counter and prescription medicines only as told by your health care provider.  Keep all follow-up visits as told by your health care provider. This is important. Where to find more information  For more information, visit the website of the Anxiety and Depression Association of America (ADAA): www.adaa.org Contact a health care provider if:  Your fear or anxiety gets worse.  You have new fears or anxieties. Get help right away if:  You have trouble breathing or have chest pain that you believe may not be part of a panic attack.  You have serious thoughts about hurting yourself or someone else. If you ever feel like you may hurt yourself or others, or have thoughts about taking your own life, get help right away. You can go to your nearest emergency department or call:  Your local emergency services (911 in the [place]  A suicide crisis helpline, such as the National Suicide Prevention Lifeline at 1-800-273-8255. This is open 24 hours a  day. Summary  Agoraphobia is a type of anxiety disorder that causes a person to avoid situations that he or she fears, such as being in public or being in crowded spaces.  People with agoraphobia often have panic attacks. They may avoid situations in which they feel escape is difficult or panic attacks are likely to occur.  Agoraphobia is treated with medicines or cognitive behavioral therapy (CBT). This information is not intended to replace advice given to you by your health care provider. Make sure you discuss any questions you have with your health care provider. Document Revised: 03/31/2019 Document Reviewed: 08/01/2017 Elsevier Patient Education  2020 Elsevier Inc.  

## 2020-07-11 NOTE — Progress Notes (Signed)
Date:  07/11/2020   Name:  Holly Foley   DOB:  21-Apr-1966   MRN:  [id number]   Chief Complaint: Depression and Anxiety  Depression        This is a chronic problem.  The current episode started more than 1 year ago.   The onset quality is sudden.   The problem occurs daily.  The problem has been gradually improving since onset.  Associated symptoms include no decreased concentration, no fatigue, no helplessness, no hopelessness, does not have insomnia, not irritable, no restlessness, no decreased interest, no appetite change, no body aches, no myalgias, no headaches, no indigestion, not sad and no suicidal ideas.     The symptoms are aggravated by medication.  Past treatments include SSRIs - Selective serotonin reuptake inhibitors.  Compliance with treatment is variable.  Previous treatment provided moderate relief.  Past medical history includes anxiety.   Anxiety Presents for follow-up visit. Symptoms include excessive worry, irritability and nervous/anxious behavior. Patient reports no chest pain, decreased concentration, dizziness, insomnia, nausea, obsessions, palpitations, restlessness, shortness of breath or suicidal ideas. Symptoms occur occasionally.      Lab Results  Component Value Date   CREATININE 0.72 04/15/2017   BUN 13 04/15/2017   NA 141 04/15/2017   K 4.7 04/15/2017   CL 102 04/15/2017   CO2 24 04/15/2017   Lab Results  Component Value Date   CHOL 131 04/15/2017   HDL 45 04/15/2017   LDLCALC 67 04/15/2017   TRIG 93 04/15/2017   CHOLHDL 2.9 04/15/2017   No results found for: TSH Lab Results  Component Value Date   HGBA1C 7.5 01/15/2020   Lab Results  Component Value Date   WBC 11.0 (H) 05/08/2017   HGB 13.8 05/08/2017   HCT 43.1 05/08/2017   MCV 84 05/08/2017   PLT 258 05/08/2017   No results found for: ALT, AST, GGT, ALKPHOS, BILITOT   Review of Systems  Constitutional: Positive for irritability. Negative for appetite change, chills, fatigue,  fever and unexpected weight change.  HENT: Negative for congestion, ear discharge, ear pain, rhinorrhea, sinus pressure, sneezing and sore throat.   Eyes: Negative for photophobia, pain, discharge, redness and itching.  Respiratory: Negative for cough, shortness of breath, wheezing and stridor.   Cardiovascular: Negative for chest pain and palpitations.  Gastrointestinal: Negative for abdominal pain, blood in stool, constipation, diarrhea, nausea and vomiting.  Endocrine: Negative for cold intolerance, heat intolerance, polydipsia, polyphagia and polyuria.  Genitourinary: Negative for dysuria, flank pain, frequency, hematuria, menstrual problem, pelvic pain, urgency, vaginal bleeding and vaginal discharge.  Musculoskeletal: Negative for arthralgias, back pain and myalgias.  Skin: Negative for rash.  Allergic/Immunologic: Negative for environmental allergies and food allergies.  Neurological: Negative for dizziness, weakness, light-headedness, numbness and headaches.  Hematological: Negative for adenopathy. Does not bruise/bleed easily.  Psychiatric/Behavioral: Positive for depression. Negative for decreased concentration, dysphoric mood and suicidal ideas. The patient is nervous/anxious. The patient does not have insomnia.     Patient Active Problem List   Diagnosis Date Noted  . Special screening for malignant neoplasms, colon   . Recurrent major depressive disorder, in partial remission ([facility]) 04/15/2017  . Reactive depression 04/15/2017  . Colon cancer screening 04/15/2017  . Class 2 obesity with serious comorbidity and body mass index (BMI) of 39.0 to 39.9 in adult 04/15/2017  . Recurrent ventral incisional hernia 07/03/2016  . Periumbilical abdominal pain 06/25/2016  . HLD (hyperlipidemia) 01/31/2016  . Essential hypertension 01/31/2016  . Near syncope  01/31/2016  . Fast heart beat 01/31/2016  . B12 deficiency 08/31/2014  . Diabetes mellitus (HCC) 08/31/2014  . Type 2 diabetes  mellitus (HCC) 08/24/2014    Allergies  Allergen Reactions  . Canagliflozin Rash    Past Surgical History:  Procedure Laterality Date  . ABLATION    . CHOLECYSTECTOMY    . COLONOSCOPY WITH PROPOFOL N/A 05/10/2017   Procedure: COLONOSCOPY WITH PROPOFOL;  Surgeon: Midge MiniumWohl, Darren, MD;  [facility];  Service: Endoscopy;  Laterality: N/A;  . HERNIA REPAIR    . PILONIDAL CYST EXCISION      Social History   Tobacco Use  . Smoking status: Former Games developermoker  . Smokeless tobacco: Never Used  Substance Use Topics  . Alcohol use: Yes    Alcohol/week: 0.0 standard drinks    Comment: occasionally  . Drug use: No     Medication list has been reviewed and updated.  Current Meds  Medication Sig  . aspirin EC 81 MG tablet Take 1 tablet by mouth daily.  . BD PEN NEEDLE NANO U/F 32G X 4 MM MISC   . Dulaglutide (TRULICITY) 1.5 MG/0.5ML SOPN Inject into the skin.  Marland Kitchen. empagliflozin (JARDIANCE) 10 MG TABS tablet Take 1 tablet by mouth daily.  Marland Kitchen. glimepiride (AMARYL) 2 MG tablet Take 1 tablet by mouth daily. [employer] Blackwood  . glucose blood (ONE TOUCH ULTRA TEST) test strip Dr Renae FicklePaul  . metFORMIN (GLUMETZA) 500 MG (MOD) 24 hr tablet Take 1,000 mg by mouth 2 (two) times daily with a meal.  . sertraline (ZOLOFT) 100 MG tablet Take 1 tablet (100 mg total) by mouth daily.  . vitamin B-12 (CYANOCOBALAMIN) 1000 MCG tablet Take 1,000 mcg by mouth daily.    PHQ 2/9 Scores 07/11/2020 10/14/2019 02/05/2019 01/31/2016  PHQ - 2 Score 0 4 0 0  PHQ- 9 Score 0 12 2 -    GAD 7 : Generalized Anxiety Score 07/11/2020  Nervous, Anxious, on Edge 2  Control/stop worrying 0  Worry too much - different things 0  Trouble relaxing 0  Restless 0  Easily annoyed or irritable 0  Afraid - awful might happen 0  Total GAD 7 Score 2  Anxiety Difficulty Somewhat difficult    BP Readings from Last 3 Encounters:  07/11/20 120/80  10/14/19 122/82  04/14/19 120/80    Physical Exam Vitals and nursing note  reviewed.  Constitutional:      General: She is not irritable.    Appearance: She is well-developed.  HENT:     Head: Normocephalic.     Right Ear: Tympanic membrane and external ear normal. There is no impacted cerumen.     Left Ear: Tympanic membrane and external ear normal. There is no impacted cerumen.     Nose: Nose normal.     Mouth/Throat:     Mouth: Mucous membranes are moist.  Eyes:     General: Lids are everted, no foreign bodies appreciated. No scleral icterus.       Left eye: No foreign body or hordeolum.     Conjunctiva/sclera: Conjunctivae normal.     Right eye: Right conjunctiva is not injected.     Left eye: Left conjunctiva is not injected.     Pupils: Pupils are equal, round, and reactive to light.  Neck:     Thyroid: No thyromegaly.     Vascular: No JVD.     Trachea: No tracheal deviation.  Cardiovascular:     Rate and Rhythm: Normal rate and regular  rhythm.     Pulses:          Dorsalis pedis pulses are 2+ on the right side and 2+ on the left side.       Posterior tibial pulses are 2+ on the right side and 2+ on the left side.     Heart sounds: Normal heart sounds. No murmur heard.  No friction rub. No gallop.   Pulmonary:     Effort: Pulmonary effort is normal. No respiratory distress.     Breath sounds: Normal breath sounds. No wheezing or rales.  Abdominal:     General: Bowel sounds are normal.     Palpations: Abdomen is soft. There is no mass.     Tenderness: There is no abdominal tenderness. There is no guarding or rebound.  Musculoskeletal:        General: No tenderness. Normal range of motion.     Cervical back: Normal range of motion and neck supple.     Right foot: Normal range of motion. No deformity or bunion.     Left foot: Normal range of motion. No deformity or bunion.  Feet:     Right foot:     Protective Sensation: 10 sites tested. 10 sites sensed.     Skin integrity: Skin integrity normal. No erythema, callus or dry skin.     Toenail  Condition: Right toenails are normal.     Left foot:     Protective Sensation: 10 sites tested. 10 sites sensed.     Skin integrity: Skin integrity normal. No erythema, callus or dry skin.     Toenail Condition: Left toenails are normal.  Lymphadenopathy:     Cervical: No cervical adenopathy.  Skin:    General: Skin is warm.     Findings: No rash.  Neurological:     Mental Status: She is alert and oriented to person, place, and time.     Cranial Nerves: No cranial nerve deficit.     Deep Tendon Reflexes: Reflexes normal.  Psychiatric:        Mood and Affect: Mood is not anxious or depressed.     Wt Readings from Last 3 Encounters:  07/11/20 184 lb (83.5 kg)  10/14/19 195 lb (88.5 kg)  04/14/19 201 lb (91.2 kg)    BP 120/80   Pulse 76   Ht 5' (1.524 m)   Wt 184 lb (83.5 kg)   BMI 35.94 kg/m    Assessment and Plan:  1. Reactive depression Chronic.  Controlled.  Stable.  Continue sertraline 100 mg once a day.  PHQ score 0- busPIRone (BUSPAR) 5 MG tablet; Take 1 tablet (5 mg total) by mouth 3 (three) times daily.  Dispense: 60 tablet; Refill: 5  2. Anxiety Chronic.  Uncontrolled.  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  Gad score 2.  We will add buspirone 5 mg 1 twice a day - busPIRone (BUSPAR) 5 MG tablet; Take 1 tablet (5 mg total) by mouth 2 times daily.  Dispense: 60 tablet; Refill: 5  3. Recurrent major depressive disorder, in partial remission (HCC) As noted above gad score 0 we will continue sertraline 100 mg once a day. - sertraline (ZOLOFT) 100 MG tablet; Take 1 tablet (100 mg total) by mouth daily.  Dispense: 90 tablet; Refill: 2  4. Type 2 diabetes mellitus without complication, without long-term current use of insulin (HCC) Patient is followed by Dr. Starla Link for her adult onset diabetes.  Patient is doing well but needs  a foot exam.  Patient had foot exam and there was no abnormality.

## 2020-07-14 ENCOUNTER — Encounter: Payer: Self-pay | Admitting: Family Medicine

## 2020-07-15 LAB — HEMOGLOBIN A1C: Hemoglobin A1C: 10.1

## 2020-10-05 ENCOUNTER — Encounter: Payer: Self-pay | Admitting: Family Medicine

## 2020-10-05 ENCOUNTER — Other Ambulatory Visit: Payer: Self-pay

## 2020-10-05 ENCOUNTER — Ambulatory Visit (INDEPENDENT_AMBULATORY_CARE_PROVIDER_SITE_OTHER): Payer: Managed Care, Other (non HMO) | Admitting: Family Medicine

## 2020-10-05 VITALS — BP 140/100 | HR 68 | Temp 98.2°F | Ht 60.0 in | Wt 179.0 lb

## 2020-10-05 DIAGNOSIS — R1115 Cyclical vomiting syndrome unrelated to migraine: Secondary | ICD-10-CM

## 2020-10-05 DIAGNOSIS — F3341 Major depressive disorder, recurrent, in partial remission: Secondary | ICD-10-CM | POA: Diagnosis not present

## 2020-10-05 DIAGNOSIS — R634 Abnormal weight loss: Secondary | ICD-10-CM

## 2020-10-05 DIAGNOSIS — E7889 Other lipoprotein metabolism disorders: Secondary | ICD-10-CM

## 2020-10-05 DIAGNOSIS — R5383 Other fatigue: Secondary | ICD-10-CM

## 2020-10-05 DIAGNOSIS — Z23 Encounter for immunization: Secondary | ICD-10-CM

## 2020-10-05 DIAGNOSIS — E8889 Other specified metabolic disorders: Secondary | ICD-10-CM

## 2020-10-05 DIAGNOSIS — R1011 Right upper quadrant pain: Secondary | ICD-10-CM

## 2020-10-05 NOTE — Progress Notes (Signed)
Date:  10/05/2020   Name:  Holly Foley   DOB:  23-Feb-1966   MRN:  [id number]   Chief Complaint: Fatigue (weight loss, hair thinning ), Nasal Congestion, and Flu Vaccine  Thyroid Problem Presents for initial visit. Symptoms include cold intolerance, fatigue, hair loss, heat intolerance and weight loss. Patient reports no anxiety, constipation, depressed mood, diaphoresis, diarrhea, dry skin, hoarse voice, leg swelling, menstrual problem, nail problem, palpitations, tremors, visual change or weight gain. The symptoms have been worsening. The treatment provided moderate relief. The following procedures have not been performed: radioiodine uptake scan, thyroid FNA, thyroid ultrasound and thyroidectomy.  Emesis  This is a chronic problem. The current episode started more than 1 year ago. The problem occurs intermittently. Progression since onset: 1-3/week. The emesis has an appearance of stomach contents. There has been no fever. Associated symptoms include abdominal pain and weight loss. Pertinent negatives include no arthralgias, chest pain, chills, coughing, diarrhea, dizziness, fever, headaches, myalgias or sweats. Associated symptoms comments: Pain since cholecystectomy.  GI Problem The primary symptoms include weight loss, fatigue, abdominal pain and vomiting. Primary symptoms do not include fever, nausea, diarrhea, melena, hematemesis, hematochezia, dysuria, myalgias, arthralgias or rash. Primary symptoms comment: all of a sudden emesis.  The weight loss began more than 2 weeks ago.  The illness is also significant for bloating and back pain. The illness does not include chills, dysphagia or constipation. Significant associated medical issues include gallstones. Associated medical issues do not include alcohol abuse.    Lab Results  Component Value Date   CREATININE 0.72 04/15/2017   BUN 13 04/15/2017   NA 141 04/15/2017   K 4.7 04/15/2017   CL 102 04/15/2017   CO2 24 04/15/2017    Lab Results  Component Value Date   CHOL 131 04/15/2017   HDL 45 04/15/2017   LDLCALC 67 04/15/2017   TRIG 93 04/15/2017   CHOLHDL 2.9 04/15/2017   No results found for: TSH Lab Results  Component Value Date   HGBA1C 10.1 07/15/2020   Lab Results  Component Value Date   WBC 11.0 (H) 05/08/2017   HGB 13.8 05/08/2017   HCT 43.1 05/08/2017   MCV 84 05/08/2017   PLT 258 05/08/2017   No results found for: ALT, AST, GGT, ALKPHOS, BILITOT   Review of Systems  Constitutional: Positive for fatigue and weight loss. Negative for chills, diaphoresis, fever, unexpected weight change and weight gain.  HENT: Negative for congestion, ear discharge, ear pain, hoarse voice, rhinorrhea, sinus pressure, sneezing and sore throat.   Eyes: Negative for photophobia, pain, discharge, redness and itching.  Respiratory: Negative for cough, shortness of breath, wheezing and stridor.   Cardiovascular: Negative for chest pain and palpitations.  Gastrointestinal: Positive for abdominal pain, bloating and vomiting. Negative for abdominal distention, anal bleeding, blood in stool, constipation, diarrhea, dysphagia, hematemesis, hematochezia, melena and nausea.  Endocrine: Positive for cold intolerance and heat intolerance. Negative for polydipsia, polyphagia and polyuria.  Genitourinary: Negative for dysuria, flank pain, frequency, hematuria, menstrual problem, pelvic pain, urgency, vaginal bleeding and vaginal discharge.  Musculoskeletal: Positive for back pain. Negative for arthralgias and myalgias.  Skin: Negative for rash.  Allergic/Immunologic: Negative for environmental allergies and food allergies.  Neurological: Negative for dizziness, tremors, weakness, light-headedness, numbness and headaches.  Hematological: Negative for adenopathy. Does not bruise/bleed easily.  Psychiatric/Behavioral: Negative for dysphoric mood. The patient is not nervous/anxious.     Patient Active Problem List    Diagnosis Date Noted  . Special  screening for malignant neoplasms, colon   . Recurrent major depressive disorder, in partial remission (HCC) 04/15/2017  . Reactive depression 04/15/2017  . Colon cancer screening 04/15/2017  . Class 2 obesity with serious comorbidity and body mass index (BMI) of 39.0 to 39.9 in adult 04/15/2017  . Recurrent ventral incisional hernia 07/03/2016  . Periumbilical abdominal pain 06/25/2016  . HLD (hyperlipidemia) 01/31/2016  . Essential hypertension 01/31/2016  . Near syncope 01/31/2016  . Fast heart beat 01/31/2016  . B12 deficiency 08/31/2014  . Diabetes mellitus (HCC) 08/31/2014  . Type 2 diabetes mellitus (HCC) 08/24/2014    Allergies  Allergen Reactions  . Canagliflozin Rash    Past Surgical History:  Procedure Laterality Date  . ABLATION    . CHOLECYSTECTOMY    . COLONOSCOPY WITH PROPOFOL N/A 05/10/2017   Procedure: COLONOSCOPY WITH PROPOFOL;  Surgeon: Midge Minium, MD;  Location: [facility];  Service: Endoscopy;  Laterality: N/A;  . HERNIA REPAIR    . PILONIDAL CYST EXCISION      Social History   Tobacco Use  . Smoking status: Former Games developer  . Smokeless tobacco: Never Used  Substance Use Topics  . Alcohol use: Yes    Alcohol/week: 0.0 standard drinks    Comment: occasionally  . Drug use: No     Medication list has been reviewed and updated.  Current Meds  Medication Sig  . aspirin EC 81 MG tablet Take 1 tablet by mouth daily.  . BD PEN NEEDLE NANO U/F 32G X 4 MM MISC   . busPIRone (BUSPAR) 5 MG tablet Take 1 tablet (5 mg total) by mouth 2 (two) times daily.  . Dulaglutide (TRULICITY) 3 MG/0.5ML SOPN Inject into the skin.   Marland Kitchen empagliflozin (JARDIANCE) 10 MG TABS tablet Take 1 tablet by mouth daily.  Marland Kitchen glimepiride (AMARYL) 2 MG tablet Take 1 tablet by mouth daily. [employer]  . glucose blood (ONE TOUCH ULTRA TEST) test strip Dr Renae Fickle  . metFORMIN (GLUMETZA) 500 MG (MOD) 24 hr tablet Take 1,000 mg by mouth 2 (two)  times daily with a meal.  . sertraline (ZOLOFT) 100 MG tablet Take 1 tablet (100 mg total) by mouth daily.  . vitamin B-12 (CYANOCOBALAMIN) 1000 MCG tablet Take 1,000 mcg by mouth daily.    PHQ 2/9 Scores 10/05/2020 07/11/2020 10/14/2019 02/05/2019  PHQ - 2 Score 0 0 4 0  PHQ- 9 Score 8 0 12 2    GAD 7 : Generalized Anxiety Score 10/05/2020 07/11/2020  Nervous, Anxious, on Edge 0 2  Control/stop worrying 1 0  Worry too much - different things 0 0  Trouble relaxing 0 0  Restless 0 0  Easily annoyed or irritable 2 0  Afraid - awful might happen 1 0  Total GAD 7 Score 4 2  Anxiety Difficulty Not difficult at all Somewhat difficult    BP Readings from Last 3 Encounters:  10/05/20 (!) 140/100  07/11/20 120/80  10/14/19 122/82    Physical Exam Vitals and nursing note reviewed.  Constitutional:      General: She is not in acute distress.    Appearance: She is not diaphoretic.  HENT:     Head: Normocephalic and atraumatic.     Right Ear: Tympanic membrane, ear canal and external ear normal.     Left Ear: Tympanic membrane, ear canal and external ear normal.     Nose: Nose normal.  Eyes:     General:        Right eye:  No discharge.        Left eye: No discharge.     Conjunctiva/sclera: Conjunctivae normal.     Pupils: Pupils are equal, round, and reactive to light.  Neck:     Thyroid: No thyromegaly.     Vascular: No JVD.  Cardiovascular:     Rate and Rhythm: Normal rate and regular rhythm.     Heart sounds: Normal heart sounds. No murmur heard.  No friction rub. No gallop.   Pulmonary:     Effort: Pulmonary effort is normal.     Breath sounds: Normal breath sounds. No wheezing or rhonchi.  Abdominal:     General: Bowel sounds are normal.     Palpations: Abdomen is soft. There is no hepatomegaly, splenomegaly or mass.     Tenderness: There is abdominal tenderness in the right upper quadrant. There is no right CVA tenderness, left CVA tenderness, guarding or rebound.   Musculoskeletal:        General: Normal range of motion.     Cervical back: Normal range of motion and neck supple.  Lymphadenopathy:     Cervical: No cervical adenopathy.  Skin:    General: Skin is warm and dry.  Neurological:     Mental Status: She is alert.     Deep Tendon Reflexes: Reflexes are normal and symmetric.     Wt Readings from Last 3 Encounters:  10/05/20 179 lb (81.2 kg)  07/11/20 184 lb (83.5 kg)  10/14/19 195 lb (88.5 kg)    BP (!) 140/100   Pulse 68   Temp 98.2 F (36.8 C) (Oral)   Ht 5' (1.524 m)   Wt 179 lb (81.2 kg)   BMI 34.96 kg/m   Assessment and Plan:

## 2020-10-06 LAB — CBC WITH DIFFERENTIAL/PLATELET
Basophils Absolute: 0 10*3/uL (ref 0.0–0.2)
Basos: 0 %
EOS (ABSOLUTE): 0.1 10*3/uL (ref 0.0–0.4)
Eos: 1 %
Hematocrit: 48.8 % — ABNORMAL HIGH (ref 34.0–46.6)
Hemoglobin: 16.8 g/dL — ABNORMAL HIGH (ref 11.1–15.9)
Immature Grans (Abs): 0 10*3/uL (ref 0.0–0.1)
Immature Granulocytes: 0 %
Lymphocytes Absolute: 2.8 10*3/uL (ref 0.7–3.1)
Lymphs: 25 %
MCH: 28.6 pg (ref 26.6–33.0)
MCHC: 34.4 g/dL (ref 31.5–35.7)
MCV: 83 fL (ref 79–97)
Monocytes Absolute: 0.5 10*3/uL (ref 0.1–0.9)
Monocytes: 5 %
Neutrophils Absolute: 7.7 10*3/uL — ABNORMAL HIGH (ref 1.4–7.0)
Neutrophils: 69 %
Platelets: 277 10*3/uL (ref 150–450)
RBC: 5.88 x10E6/uL — ABNORMAL HIGH (ref 3.77–5.28)
RDW: 13.4 % (ref 11.7–15.4)
WBC: 11.2 10*3/uL — ABNORMAL HIGH (ref 3.4–10.8)

## 2020-10-06 LAB — HEPATIC FUNCTION PANEL (6)
ALT: 16 IU/L (ref 0–32)
AST: 22 IU/L (ref 0–40)
Alkaline Phosphatase: 105 IU/L (ref 44–121)
Bilirubin Total: 0.6 mg/dL (ref 0.0–1.2)
Bilirubin, Direct: 0.16 mg/dL (ref 0.00–0.40)

## 2020-10-06 LAB — RENAL FUNCTION PANEL
Albumin: 4.3 g/dL (ref 3.8–4.9)
BUN/Creatinine Ratio: 14 (ref 9–23)
BUN: 11 mg/dL (ref 6–24)
CO2: 24 mmol/L (ref 20–29)
Calcium: 9.9 mg/dL (ref 8.7–10.2)
Chloride: 98 mmol/L (ref 96–106)
Creatinine, Ser: 0.8 mg/dL (ref 0.57–1.00)
GFR calc Af Amer: 97 mL/min/{1.73_m2} (ref >59)
GFR calc non Af Amer: 84 mL/min/{1.73_m2} (ref >59)
Glucose: 174 mg/dL — ABNORMAL HIGH (ref 65–99)
Phosphorus: 3.8 mg/dL (ref 3.0–4.3)
Potassium: 4.1 mmol/L (ref 3.5–5.2)
Sodium: 138 mmol/L (ref 134–144)

## 2020-10-06 LAB — THYROID PANEL WITH TSH
Free Thyroxine Index: 2.6 (ref 1.2–4.9)
T3 Uptake Ratio: 25 % (ref 24–39)
T4, Total: 10.3 ug/dL (ref 4.5–12.0)
TSH: 1.45 u[IU]/mL (ref 0.450–4.500)

## 2020-11-28 ENCOUNTER — Ambulatory Visit (INDEPENDENT_AMBULATORY_CARE_PROVIDER_SITE_OTHER): Payer: Managed Care, Other (non HMO) | Admitting: Gastroenterology

## 2020-11-28 ENCOUNTER — Encounter: Payer: Self-pay | Admitting: Gastroenterology

## 2020-11-28 ENCOUNTER — Other Ambulatory Visit: Payer: Self-pay

## 2020-11-28 DIAGNOSIS — R112 Nausea with vomiting, unspecified: Secondary | ICD-10-CM

## 2020-11-28 MED ORDER — PANTOPRAZOLE SODIUM 40 MG PO TBEC
40.0000 mg | DELAYED_RELEASE_TABLET | Freq: Every day | ORAL | 6 refills | Status: DC
Start: 2020-11-28 — End: 2021-10-31

## 2020-11-28 NOTE — Progress Notes (Signed)
Gastroenterology Consultation  Referring Provider:     Duanne Limerick, MD Primary Care Physician:  Duanne Limerick, MD Primary Gastroenterologist:  Dr. Servando Snare     Reason for Consultation:     Weight loss with vomiting and Abdominal pain        HPI:   Holly Foley is a 54 y.o. y/o female referred for consultation & management of Weight loss with vomiting and abdominal pain by Dr. Duanne Limerick, MD.  This patient comes in today after seeing me for colonoscopy back in 2018.  At this time the patient reports that she is been having some vomiting that has been going on a few times a week for the last year.  The patient has a history of a cholecystectomy.  Her liver enzymes checked in October were normal with her CBC showing a increased hemoglobin at 16.8. The patient was 197 pounds in 2018 and is 179 in October of this year. The patient reports that her vomiting comes on abruptly and is worse with stress.  She also reports that her diabetes has not been well controlled recently and her A1c was elevated.  The patient also has not been on anything for reflux.  Past Medical History:  Diagnosis Date  . Depression   . Diabetes mellitus without complication (HCC)   . Hypertension     Past Surgical History:  Procedure Laterality Date  . ABLATION    . CHOLECYSTECTOMY    . COLONOSCOPY WITH PROPOFOL N/A 05/10/2017   Procedure: COLONOSCOPY WITH PROPOFOL;  Surgeon: Midge Minium, MD;  Location: [facility];  Service: Endoscopy;  Laterality: N/A;  . HERNIA REPAIR    . PILONIDAL CYST EXCISION      Prior to Admission medications   Medication Sig Start Date End Date Taking? Authorizing Provider  aspirin EC 81 MG tablet Take 1 tablet by mouth daily.    [provider]  BD PEN NEEDLE NANO U/F 32G X 4 MM MISC  01/14/17   [provider]  busPIRone (BUSPAR) 5 MG tablet Take 1 tablet (5 mg total) by mouth 2 (two) times daily. 07/11/20   Duanne Limerick, MD  Dulaglutide  (TRULICITY) 3 MG/0.5ML SOPN Inject into the skin.     [provider]  empagliflozin (JARDIANCE) 10 MG TABS tablet Take 1 tablet by mouth daily. 04/25/18   [provider]  glimepiride (AMARYL) 2 MG tablet Take 1 tablet by mouth daily. Otelia Sergeant 11/07/18 10/05/20  [provider]  glucose blood (ONE TOUCH ULTRA TEST) test strip Dr Renae Fickle 08/09/14   [provider]  metFORMIN (GLUMETZA) 500 MG (MOD) 24 hr tablet Take 1,000 mg by mouth 2 (two) times daily with a meal.    [provider]  sertraline (ZOLOFT) 100 MG tablet Take 1 tablet (100 mg total) by mouth daily. 07/11/20   Duanne Limerick, MD  vitamin B-12 (CYANOCOBALAMIN) 1000 MCG tablet Take 1,000 mcg by mouth daily.    [provider]    Family History  Problem Relation Age of Onset  . Breast cancer Sister 50     Social History   Tobacco Use  . Smoking status: Former Games developer  . Smokeless tobacco: Never Used  Substance Use Topics  . Alcohol use: Yes    Alcohol/week: 0.0 standard drinks    Comment: occasionally  . Drug use: No    Allergies as of 11/28/2020 - Review Complete 10/05/2020  Allergen Reaction Noted  . Canagliflozin  Rash 01/24/2018    Review of Systems:    All systems reviewed and negative except where noted in HPI.   Physical Exam:  BP (!) 149/82   Pulse 76   Ht 5' (1.524 m)   Wt 179 lb 12.8 oz (81.6 kg)   BMI 35.11 kg/m  No LMP recorded. Patient has had an ablation. General:   Alert,  Well-developed, well-nourished, pleasant and cooperative in NAD Head:  Normocephalic and atraumatic. Eyes:  Sclera clear, no icterus.   Conjunctiva pink. Ears:  Normal auditory acuity. Neck:  Supple; no masses or thyromegaly. Lungs:  Respirations even and unlabored.  Clear throughout to auscultation.   No wheezes, crackles, or rhonchi. No acute distress. Heart:  Regular rate and rhythm; no murmurs, clicks, rubs, or gallops. Abdomen:  Normal bowel sounds.  No bruits.   Soft, non-tender and non-distended without masses, hepatosplenomegaly noted. The patient did have a midline hernia noted. No guarding or rebound tenderness.  Negative Carnett sign.   Rectal:  Deferred.  Pulses:  Normal pulses noted. Extremities:  No clubbing or edema.  No cyanosis. Neurologic:  Alert and oriented x3;  grossly normal neurologically. Skin:  Intact without significant lesions or rashes.  No jaundice. Lymph Nodes:  No significant cervical adenopathy. Psych:  Alert and cooperative. Normal mood and affect.  Imaging Studies: No results found.  Assessment and Plan:   [name] is a 54 y.o. y/o female who comes in with a history of vomiting and diabetes with reflux of bile. The patient has had her gallbladder out in the past.  The patient has been told that her vomiting may be due to her diabetes versus reflux.  The patient will be started on a PPI to see if her symptoms improve.  The patient will also be set up for a gastric emptying study to rule out gastroparesis as the cause of her symptoms that she does report that sometimes she will vomit up food that she had eaten a number of hours previously.  The patient has been explained the plan and agrees with it.    Midge Minium, MD. Clementeen Graham    Note: This dictation was prepared with Dragon dictation along with smaller phrase technology. Any transcriptional errors that result from this process are unintentional.

## 2021-01-10 LAB — HEMOGLOBIN A1C: Hemoglobin A1C: 7.3

## 2021-01-11 ENCOUNTER — Ambulatory Visit (INDEPENDENT_AMBULATORY_CARE_PROVIDER_SITE_OTHER): Payer: Managed Care, Other (non HMO) | Admitting: Family Medicine

## 2021-01-11 ENCOUNTER — Other Ambulatory Visit: Payer: Self-pay

## 2021-01-11 ENCOUNTER — Encounter: Payer: Self-pay | Admitting: Family Medicine

## 2021-01-11 VITALS — BP 144/110 | HR 80 | Ht 60.0 in | Wt 177.0 lb

## 2021-01-11 DIAGNOSIS — E7889 Other lipoprotein metabolism disorders: Secondary | ICD-10-CM

## 2021-01-11 DIAGNOSIS — F419 Anxiety disorder, unspecified: Secondary | ICD-10-CM | POA: Diagnosis not present

## 2021-01-11 DIAGNOSIS — F329 Major depressive disorder, single episode, unspecified: Secondary | ICD-10-CM | POA: Diagnosis not present

## 2021-01-11 DIAGNOSIS — I10 Essential (primary) hypertension: Secondary | ICD-10-CM

## 2021-01-11 DIAGNOSIS — F3341 Major depressive disorder, recurrent, in partial remission: Secondary | ICD-10-CM | POA: Diagnosis not present

## 2021-01-11 DIAGNOSIS — E8889 Other specified metabolic disorders: Secondary | ICD-10-CM

## 2021-01-11 MED ORDER — BUSPIRONE HCL 5 MG PO TABS: 5.0000 mg | ORAL_TABLET | Freq: Two times a day (BID) | ORAL | 5 refills | Status: DC

## 2021-01-11 MED ORDER — LOSARTAN POTASSIUM 50 MG PO TABS: 50.0000 mg | ORAL_TABLET | Freq: Every day | ORAL | 3 refills | Status: DC

## 2021-01-11 MED ORDER — SERTRALINE HCL 100 MG PO TABS: 100.0000 mg | ORAL_TABLET | Freq: Every day | ORAL | 1 refills | Status: DC

## 2021-01-11 NOTE — Progress Notes (Signed)
Date:  01/11/2021   Name:  Holly Foley   DOB:  07/08/1966   MRN:  [id number]   Chief Complaint: Depression (0 for PHQ) and Anxiety (0 GAD)  Depression        This is a chronic problem.  The current episode started more than 1 year ago.   The onset quality is gradual.   The problem has been gradually improving since onset.  Associated symptoms include no decreased concentration, no fatigue, no helplessness, no hopelessness, does not have insomnia, not irritable, no restlessness, no decreased interest, no appetite change, no body aches, no myalgias, no headaches, no indigestion, not sad and no suicidal ideas.     The symptoms are aggravated by nothing.  Past treatments include SSRIs - Selective serotonin reuptake inhibitors.  Compliance with treatment is variable.  Previous treatment provided mild relief.  Past medical history includes anxiety.   Anxiety Presents for follow-up visit. Patient reports no chest pain, compulsions, confusion, decreased concentration, depressed mood, dizziness, dry mouth, excessive worry, feeling of choking, hyperventilation, impotence, insomnia, irritability, malaise, muscle tension, nausea, nervous/anxious behavior, obsessions, palpitations, panic, restlessness, shortness of breath or suicidal ideas.    Hypertension This is a chronic problem. The current episode started more than 1 year ago. The problem has been gradually improving since onset. The problem is controlled. Associated symptoms include anxiety. Pertinent negatives include no blurred vision, chest pain, headaches, malaise/fatigue, neck pain, orthopnea, palpitations, peripheral edema, PND, shortness of breath or sweats. There are no associated agents to hypertension.    Lab Results  Component Value Date   CREATININE 0.80 10/05/2020   BUN 11 10/05/2020   NA 138 10/05/2020   K 4.1 10/05/2020   CL 98 10/05/2020   CO2 24 10/05/2020   Lab Results  Component Value Date   CHOL 131 04/15/2017   HDL 45  04/15/2017   LDLCALC 67 04/15/2017   TRIG 93 04/15/2017   CHOLHDL 2.9 04/15/2017   Lab Results  Component Value Date   TSH 1.450 10/05/2020   Lab Results  Component Value Date   HGBA1C 7.3 01/10/2021   Lab Results  Component Value Date   WBC 11.2 (H) 10/05/2020   HGB 16.8 (H) 10/05/2020   HCT 48.8 (H) 10/05/2020   MCV 83 10/05/2020   PLT 277 10/05/2020   Lab Results  Component Value Date   ALT 16 10/05/2020   AST 22 10/05/2020   ALKPHOS 105 10/05/2020   BILITOT 0.6 10/05/2020     Review of Systems  Constitutional: Negative.  Negative for appetite change, chills, fatigue, fever, irritability, malaise/fatigue and unexpected weight change.  HENT: Negative for congestion, ear discharge, ear pain, rhinorrhea, sinus pressure, sneezing and sore throat.   Eyes: Negative for blurred vision, double vision, photophobia, pain, discharge, redness and itching.  Respiratory: Negative for cough, shortness of breath, wheezing and stridor.   Cardiovascular: Negative for chest pain, palpitations, orthopnea and PND.  Gastrointestinal: Negative for abdominal pain, blood in stool, constipation, diarrhea, nausea and vomiting.  Endocrine: Negative for cold intolerance, heat intolerance, polydipsia, polyphagia and polyuria.  Genitourinary: Negative for dysuria, flank pain, frequency, hematuria, impotence, menstrual problem, pelvic pain, urgency, vaginal bleeding and vaginal discharge.  Musculoskeletal: Negative for arthralgias, back pain, myalgias and neck pain.  Skin: Negative for rash.  Allergic/Immunologic: Negative for environmental allergies and food allergies.  Neurological: Negative for dizziness, weakness, light-headedness, numbness and headaches.  Hematological: Negative for adenopathy. Does not bruise/bleed easily.  Psychiatric/Behavioral: Positive for depression. Negative  for confusion, decreased concentration, dysphoric mood and suicidal ideas. The patient is not nervous/anxious and  does not have insomnia.     Patient Active Problem List   Diagnosis Date Noted  . Special screening for malignant neoplasms, colon   . Recurrent major depressive disorder, in partial remission (HCC) 04/15/2017  . Reactive depression 04/15/2017  . Colon cancer screening 04/15/2017  . Class 2 obesity with serious comorbidity and body mass index (BMI) of 39.0 to 39.9 in adult 04/15/2017  . Recurrent ventral incisional hernia 07/03/2016  . Periumbilical abdominal pain 06/25/2016  . HLD (hyperlipidemia) 01/31/2016  . Essential hypertension 01/31/2016  . Near syncope 01/31/2016  . Fast heart beat 01/31/2016  . B12 deficiency 08/31/2014  . Diabetes mellitus (HCC) 08/31/2014  . Type 2 diabetes mellitus (HCC) 08/24/2014    Allergies  Allergen Reactions  . Canagliflozin Rash    Past Surgical History:  Procedure Laterality Date  . ABLATION    . CHOLECYSTECTOMY    . COLONOSCOPY WITH PROPOFOL N/A 05/10/2017   Procedure: COLONOSCOPY WITH PROPOFOL;  Surgeon: Midge Minium, MD;  Location: [facility];  Service: Endoscopy;  Laterality: N/A;  . HERNIA REPAIR    . PILONIDAL CYST EXCISION      Social History   Tobacco Use  . Smoking status: Former Games developer  . Smokeless tobacco: Never Used  Substance Use Topics  . Alcohol use: Yes    Alcohol/week: 0.0 standard drinks    Comment: occasionally  . Drug use: No     Medication list has been reviewed and updated.  Current Meds  Medication Sig  . aspirin EC 81 MG tablet Take 1 tablet by mouth daily.  . BD PEN NEEDLE NANO U/F 32G X 4 MM MISC   . busPIRone (BUSPAR) 5 MG tablet Take 1 tablet (5 mg total) by mouth 2 (two) times daily.  . Dulaglutide 3 MG/0.5ML SOPN Inject into the skin.   Marland Kitchen empagliflozin (JARDIANCE) 10 MG TABS tablet Take 1 tablet by mouth daily.  Marland Kitchen glimepiride (AMARYL) 2 MG tablet Take 1 tablet by mouth daily. [employer]  . glucose blood test strip Dr Renae Fickle  . metFORMIN (GLUMETZA) 500 MG (MOD) 24 hr tablet Take  1,000 mg by mouth 2 (two) times daily with a meal.  . pantoprazole (PROTONIX) 40 MG tablet Take 1 tablet (40 mg total) by mouth daily.  . sertraline (ZOLOFT) 100 MG tablet Take 1 tablet (100 mg total) by mouth daily.  . vitamin B-12 (CYANOCOBALAMIN) 1000 MCG tablet Take 1,000 mcg by mouth daily.    PHQ 2/9 Scores 01/11/2021 10/05/2020 07/11/2020 10/14/2019  PHQ - 2 Score 0 0 0 4  PHQ- 9 Score 0 8 0 12    GAD 7 : Generalized Anxiety Score 01/11/2021 10/05/2020 07/11/2020  Nervous, Anxious, on Edge 0 0 2  Control/stop worrying 0 1 0  Worry too much - different things 0 0 0  Trouble relaxing 0 0 0  Restless 0 0 0  Easily annoyed or irritable 0 2 0  Afraid - awful might happen 0 1 0  Total GAD 7 Score 0 4 2  Anxiety Difficulty - Not difficult at all Somewhat difficult    BP Readings from Last 3 Encounters:  01/11/21 (!) 144/110  11/28/20 (!) 149/82  10/05/20 (!) 140/100    Physical Exam Vitals and nursing note reviewed.  Constitutional:      General: She is not irritable.    Appearance: She is well-developed and well-nourished.  HENT:  Head: Normocephalic.     Right Ear: Tympanic membrane, ear canal and external ear normal. There is no impacted cerumen.     Left Ear: Tympanic membrane, ear canal and external ear normal. There is no impacted cerumen.     Nose: Nose normal.     Mouth/Throat:     Mouth: Oropharynx is clear and moist. Mucous membranes are moist.  Eyes:     General: Lids are everted, no foreign bodies appreciated. No scleral icterus.       Left eye: No foreign body or hordeolum.     Extraocular Movements: EOM normal.     Conjunctiva/sclera: Conjunctivae normal.     Right eye: Right conjunctiva is not injected.     Left eye: Left conjunctiva is not injected.     Pupils: Pupils are equal, round, and reactive to light.  Neck:     Thyroid: No thyromegaly.     Vascular: No JVD.     Trachea: No tracheal deviation.  Cardiovascular:     Rate and Rhythm: Normal  rate and regular rhythm.     Pulses: Intact distal pulses.     Heart sounds: Normal heart sounds. No murmur heard. No friction rub. No gallop.   Pulmonary:     Effort: Pulmonary effort is normal. No respiratory distress.     Breath sounds: Normal breath sounds. No wheezing, rhonchi or rales.  Abdominal:     General: Bowel sounds are normal.     Palpations: Abdomen is soft. There is no hepatosplenomegaly or mass.     Tenderness: There is no abdominal tenderness. There is no guarding or rebound.  Musculoskeletal:        General: No tenderness or edema. Normal range of motion.     Cervical back: Normal range of motion and neck supple.  Lymphadenopathy:     Cervical: No cervical adenopathy.  Skin:    General: Skin is warm.     Findings: No rash.  Neurological:     Mental Status: She is alert and oriented to person, place, and time.     Cranial Nerves: No cranial nerve deficit.     Deep Tendon Reflexes: Strength normal. Reflexes normal.  Psychiatric:        Mood and Affect: Mood and affect normal. Mood is not anxious or depressed.     Wt Readings from Last 3 Encounters:  01/11/21 177 lb (80.3 kg)  11/28/20 179 lb 12.8 oz (81.6 kg)  10/05/20 179 lb (81.2 kg)    BP (!) 144/110   Pulse 80   Ht 5' (1.524 m)   Wt 177 lb (80.3 kg)   BMI 34.57 kg/m   Assessment and Plan:  1. Essential hypertension Noted in the past.  Today's blood pressure is elevated with a reading of 144/110.  This is the first elevation in several years.  We will resume losartan at 50 mg once a day and will recheck patient in 6 weeks. - losartan (COZAAR) 50 MG tablet; Take 1 tablet (50 mg total) by mouth daily.  Dispense: 90 tablet; Refill: 3  2. Reactive depression Chronic.  Controlled.  Stable.  PHQ is 0.  Continue sertraline 100 mg 1 a day and will recheck in 6 months. - busPIRone (BUSPAR) 5 MG tablet; Take 1 tablet (5 mg total) by mouth 2 (two) times daily.  Dispense: 60 tablet; Refill: 5 - sertraline  (ZOLOFT) 100 MG tablet; Take 1 tablet (100 mg total) by mouth daily.  Dispense: 90 tablet; Refill:  1  3. Anxiety Chronic.  Controlled.  Stable.  Gad score is 0.  Continue sertraline 100 mg once a day and buspirone 5 mg 1 twice a day. - busPIRone (BUSPAR) 5 MG tablet; Take 1 tablet (5 mg total) by mouth 2 (two) times daily.  Dispense: 60 tablet; Refill: 5 - sertraline (ZOLOFT) 100 MG tablet; Take 1 tablet (100 mg total) by mouth daily.  Dispense: 90 tablet; Refill: 1  4. Recurrent major depressive disorder, in partial remission (HCC) As noted above PHQ is 0 and we will continue sertraline at current dosing. - sertraline (ZOLOFT) 100 MG tablet; Take 1 tablet (100 mg total) by mouth daily.  Dispense: 90 tablet; Refill: 1  5. Steatosis Patient has a history of steatosis is noted on CT scan in 2017.  However patient's lipid panel review notes an LDL of only 72.  Patient is overweight and weight loss has been encouraged.  We will await the 2022 labs from endocrine on decision to start on a statin agent even at a low dose or once or twice a week given her diabetes.

## 2021-01-11 NOTE — Patient Instructions (Signed)
Recombinant Zoster (Shingles) Vaccine: What You Need to Know 1. Why get vaccinated? Recombinant zoster (shingles) vaccine can prevent shingles. Shingles (also called herpes zoster, or just zoster) is a painful skin rash, usually with blisters. In addition to the rash, shingles can cause fever, headache, chills, or upset stomach. More rarely, shingles can lead to pneumonia, hearing problems, blindness, brain inflammation (encephalitis), or death. The most common complication of shingles is long-term nerve pain called postherpetic neuralgia (PHN). PHN occurs in the areas where the shingles rash was, even after the rash clears up. It can last for months or years after the rash goes away. The pain from PHN can be severe and debilitating. About 10 to 18% of people who get shingles will experience PHN. The risk of PHN increases with age. An older adult with shingles is more likely to develop PHN and have longer lasting and more severe pain than a younger person with shingles. Shingles is caused by the varicella zoster virus, the same virus that causes chickenpox. After you have chickenpox, the virus stays in your body and can cause shingles later in life. Shingles cannot be passed from one person to another, but the virus that causes shingles can spread and cause chickenpox in someone who had never had chickenpox or received chickenpox vaccine. 2. Recombinant shingles vaccine Recombinant shingles vaccine provides strong protection against shingles. By preventing shingles, recombinant shingles vaccine also protects against PHN. Recombinant shingles vaccine is the preferred vaccine for the prevention of shingles. However, a different vaccine, live shingles vaccine, may be used in some circumstances. The recombinant shingles vaccine is recommended for adults 50 years and older without serious immune problems. It is given as a two-dose series. This vaccine is also recommended for people who have already gotten  another type of shingles vaccine, the live shingles vaccine. There is no live virus in this vaccine. Shingles vaccine may be given at the same time as other vaccines. 3. Talk with your health care provider Tell your vaccine provider if the person getting the vaccine:  Has had an allergic reaction after a previous dose of recombinant shingles vaccine, or has any severe, life-threatening allergies.  Is pregnant or breastfeeding.  Is currently experiencing an episode of shingles. In some cases, your health care provider may decide to postpone shingles vaccination to a future visit. People with minor illnesses, such as a cold, may be vaccinated. People who are moderately or severely ill should usually wait until they recover before getting recombinant shingles vaccine. Your health care provider can give you more information. 4. Risks of a vaccine reaction  A sore arm with mild or moderate pain is very common after recombinant shingles vaccine, affecting about 80% of vaccinated people. Redness and swelling can also happen at the site of the injection.  Tiredness, muscle pain, headache, shivering, fever, stomach pain, and nausea happen after vaccination in more than half of people who receive recombinant shingles vaccine. In clinical trials, about 1 out of 6 people who got recombinant zoster vaccine experienced side effects that prevented them from doing regular activities. Symptoms usually went away on their own in 2 to 3 days. You should still get the second dose of recombinant zoster vaccine even if you had one of these reactions after the first dose. People sometimes faint after medical procedures, including vaccination. Tell your provider if you feel dizzy or have vision changes or ringing in the ears. As with any medicine, there is a very remote chance of a vaccine causing   a severe allergic reaction, other serious injury, or death. 5. What if there is a serious problem? An allergic reaction  could occur after the vaccinated person leaves the clinic. If you see signs of a severe allergic reaction (hives, swelling of the face and throat, difficulty breathing, a fast heartbeat, dizziness, or weakness), call 9-1-1 and get the person to the nearest hospital. For other signs that concern you, call your health care provider. Adverse reactions should be reported to the Vaccine Adverse Event Reporting System (VAERS). Your health care provider will usually file this report, or you can do it yourself. Visit the VAERS website at www.vaers.hhs.gov or call 1-800-822-7967. VAERS is only for reporting reactions, and VAERS staff do not give medical advice. 6. How can I learn more?  Ask your health care provider.  Call your local or state health department.  Contact the Centers for Disease Control and Prevention (CDC): ? Call 1-800-232-4636 (1-800-CDC-INFO) or ? Visit CDC's website at www.cdc.gov/vaccines Vaccine Information Statement Recombinant Zoster Vaccine (10/22/2018) This information is not intended to replace advice given to you by your health care provider. Make sure you discuss any questions you have with your health care provider. Document Revised: 08/12/2020 Document Reviewed: 08/12/2020 Elsevier Patient Education  2021 Elsevier Inc.  

## 2021-01-20 ENCOUNTER — Other Ambulatory Visit: Payer: Self-pay

## 2021-01-20 ENCOUNTER — Encounter
Admission: RE | Admit: 2021-01-20 | Discharge: 2021-01-20 | Disposition: A | Discharge status: Home / Self Care | Payer: Managed Care, Other (non HMO) | Source: Ambulatory Visit | Attending: Gastroenterology | Admitting: Gastroenterology

## 2021-01-20 DIAGNOSIS — R112 Nausea with vomiting, unspecified: Secondary | ICD-10-CM | POA: Insufficient documentation

## 2021-01-24 ENCOUNTER — Other Ambulatory Visit: Payer: Self-pay

## 2021-01-24 DIAGNOSIS — R112 Nausea with vomiting, unspecified: Secondary | ICD-10-CM

## 2021-01-25 ENCOUNTER — Encounter: Payer: Self-pay | Admitting: Gastroenterology

## 2021-01-25 ENCOUNTER — Other Ambulatory Visit
Admission: RE | Admit: 2021-01-25 | Discharge: 2021-01-25 | Disposition: A | Discharge status: Home / Self Care | Payer: Managed Care, Other (non HMO) | Source: Ambulatory Visit | Attending: Gastroenterology | Admitting: Gastroenterology

## 2021-01-25 ENCOUNTER — Other Ambulatory Visit: Payer: Self-pay

## 2021-01-25 DIAGNOSIS — Z01812 Encounter for preprocedural laboratory examination: Secondary | ICD-10-CM | POA: Diagnosis present

## 2021-01-25 DIAGNOSIS — Z20822 Contact with and (suspected) exposure to covid-19: Secondary | ICD-10-CM | POA: Insufficient documentation

## 2021-01-25 LAB — SARS CORONAVIRUS 2 (TAT 6-24 HRS): SARS Coronavirus 2: NEGATIVE

## 2021-01-26 NOTE — Discharge Instructions (Signed)

## 2021-01-27 ENCOUNTER — Ambulatory Visit: Payer: Managed Care, Other (non HMO) | Admitting: Anesthesiology

## 2021-01-27 ENCOUNTER — Other Ambulatory Visit: Payer: Self-pay

## 2021-01-27 ENCOUNTER — Encounter: Payer: Self-pay | Admitting: Gastroenterology

## 2021-01-27 ENCOUNTER — Encounter
Admission: RE | Disposition: A | Discharge status: Home / Self Care | Payer: Self-pay | Source: Home / Self Care | Attending: Gastroenterology

## 2021-01-27 ENCOUNTER — Ambulatory Visit
Admission: RE | Admit: 2021-01-27 | Discharge: 2021-01-27 | Disposition: A | Discharge status: Home / Self Care | Payer: Managed Care, Other (non HMO) | Attending: Gastroenterology | Admitting: Gastroenterology

## 2021-01-27 DIAGNOSIS — R112 Nausea with vomiting, unspecified: Secondary | ICD-10-CM | POA: Diagnosis not present

## 2021-01-27 DIAGNOSIS — K449 Diaphragmatic hernia without obstruction or gangrene: Secondary | ICD-10-CM | POA: Diagnosis not present

## 2021-01-27 DIAGNOSIS — Z79899 Other long term (current) drug therapy: Secondary | ICD-10-CM | POA: Diagnosis not present

## 2021-01-27 DIAGNOSIS — Z7984 Long term (current) use of oral hypoglycemic drugs: Secondary | ICD-10-CM | POA: Diagnosis not present

## 2021-01-27 DIAGNOSIS — R1110 Vomiting, unspecified: Secondary | ICD-10-CM

## 2021-01-27 DIAGNOSIS — Z7982 Long term (current) use of aspirin: Secondary | ICD-10-CM | POA: Diagnosis not present

## 2021-01-27 DIAGNOSIS — Z87891 Personal history of nicotine dependence: Secondary | ICD-10-CM | POA: Insufficient documentation

## 2021-01-27 HISTORY — PX: ESOPHAGOGASTRODUODENOSCOPY (EGD) WITH PROPOFOL: SHX5813

## 2021-01-27 LAB — GLUCOSE, CAPILLARY
Glucose-Capillary: 182 mg/dL — ABNORMAL HIGH (ref 70–99)
Glucose-Capillary: 182 mg/dL — ABNORMAL HIGH (ref 70–99)

## 2021-01-27 SURGERY — ESOPHAGOGASTRODUODENOSCOPY (EGD) WITH PROPOFOL
Anesthesia: General

## 2021-01-27 MED ORDER — LACTATED RINGERS IV SOLN: INTRAVENOUS | Status: DC

## 2021-01-27 MED ORDER — LIDOCAINE HCL (CARDIAC) PF 100 MG/5ML IV SOSY
PREFILLED_SYRINGE | INTRAVENOUS | Status: DC | PRN
  Administered 2021-01-27: 60 mg via INTRAVENOUS

## 2021-01-27 MED ORDER — PROPOFOL 10 MG/ML IV BOLUS
INTRAVENOUS | Status: DC | PRN
  Administered 2021-01-27: 60 mg via INTRAVENOUS
  Administered 2021-01-27: 30 mg via INTRAVENOUS
  Administered 2021-01-27: 20 mg via INTRAVENOUS

## 2021-01-27 SURGICAL SUPPLY — 34 items
BALLN DILATOR 10-12 8 (BALLOONS)
BALLN DILATOR 12-15 8 (BALLOONS)
BALLN DILATOR 15-18 8 (BALLOONS)
BALLN DILATOR CRE 0-12 8 (BALLOONS)
BALLN DILATOR ESOPH 8 10 CRE (MISCELLANEOUS) IMPLANT
BALLOON DILATOR 12-15 8 (BALLOONS) IMPLANT
BALLOON DILATOR 15-18 8 (BALLOONS) IMPLANT
BALLOON DILATOR CRE 0-12 8 (BALLOONS) IMPLANT
BLOCK BITE 60FR ADLT L/F GRN (MISCELLANEOUS) ×2 IMPLANT
CLIP HMST 235XBRD CATH ROT (MISCELLANEOUS) IMPLANT
CLIP RESOLUTION 360 11X235 (MISCELLANEOUS)
ELECT REM PT RETURN 9FT ADLT (ELECTROSURGICAL)
ELECTRODE REM PT RTRN 9FT ADLT (ELECTROSURGICAL) IMPLANT
FCP ESCP3.2XJMB 240X2.8X (MISCELLANEOUS)
FORCEPS BIOP RAD 4 LRG CAP 4 (CUTTING FORCEPS) IMPLANT
FORCEPS BIOP RJ4 240 W/NDL (MISCELLANEOUS)
FORCEPS ESCP3.2XJMB 240X2.8X (MISCELLANEOUS) IMPLANT
GOWN CVR UNV OPN BCK APRN NK (MISCELLANEOUS) ×2 IMPLANT
GOWN ISOL THUMB LOOP REG UNIV (MISCELLANEOUS) ×4
INJECTOR VARIJECT VIN23 (MISCELLANEOUS) IMPLANT
KIT DEFENDO VALVE AND CONN (KITS) IMPLANT
KIT PRC NS LF DISP ENDO (KITS) ×1 IMPLANT
KIT PROCEDURE OLYMPUS (KITS) ×2
MANIFOLD NEPTUNE II (INSTRUMENTS) ×2 IMPLANT
MARKER SPOT ENDO TATTOO 5ML (MISCELLANEOUS) IMPLANT
RETRIEVER NET PLAT FOOD (MISCELLANEOUS) IMPLANT
SNARE SHORT THROW 13M SML OVAL (MISCELLANEOUS) IMPLANT
SNARE SHORT THROW 30M LRG OVAL (MISCELLANEOUS) IMPLANT
SPOT EX ENDOSCOPIC TATTOO (MISCELLANEOUS)
SYR INFLATION 60ML (SYRINGE) IMPLANT
TRAP ETRAP POLY (MISCELLANEOUS) IMPLANT
VARIJECT INJECTOR VIN23 (MISCELLANEOUS)
WATER STERILE IRR 250ML POUR (IV SOLUTION) ×2 IMPLANT
WIRE CRE 18-20MM 8CM F G (MISCELLANEOUS) IMPLANT

## 2021-01-27 NOTE — Op Note (Signed)
[facility] Patient Name: Holly Foley Procedure Date: 01/27/2021 8:04 AM MRN: [id number] Account #: [id number] Date of Birth: [birth year] Admit Type: Outpatient Age: 55 Room: [facility] OR ROOM 01 Gender: Female Note Status: Finalized Procedure:             Upper GI endoscopy Indications:           Nausea with vomiting Providers:             Midge Minium MD, MD Referring MD:          Duanne Limerick, MD (Referring MD) Medicines:             Propofol per Anesthesia Complications:         No immediate complications. Procedure:             Pre-Anesthesia Assessment:                        - Prior to the procedure, a History and Physical was                         performed, and patient medications and allergies were                         reviewed. The patient's tolerance of previous                         anesthesia was also reviewed. The risks and benefits                         of the procedure and the sedation options and risks                         were discussed with the patient. All questions were                         answered, and informed consent was obtained. Prior                         Anticoagulants: The patient has taken no previous                         anticoagulant or antiplatelet agents. ASA Grade                         Assessment: II - A patient with mild systemic disease.                         After reviewing the risks and benefits, the patient                         was deemed in satisfactory condition to undergo the                         procedure.                        After obtaining informed consent, the endoscope was  passed under direct vision. Throughout the procedure,                         the patient's blood pressure, pulse, and oxygen                         saturations were monitored continuously. The was                         introduced through the mouth, and advanced to the                          second part of duodenum. The upper GI endoscopy was                         accomplished without difficulty. The patient tolerated                         the procedure well. Findings:      A small hiatal hernia was present.      A medium amount of food (residue) was found in the entire examined       stomach.      The examined duodenum was normal.      Biopsies were obtained with cold forceps for histology in the middle       third of the esophagus. Impression:            - Small hiatal hernia.                        - A medium amount of food (residue) in the stomach.                        - Normal examined duodenum.                        - Biopsy performed in the middle third of the                         esophagus.                        - The patient could not tolerate a gastric emptying                         study and durng this exam the stomach was observed to                         have no contractions. Although this is not dagnositic                         it s suggestive of gastroparesis Recommendation:        - Discharge patient to home.                        - Resume previous diet.                        - Continue present medications.                        -  Await pathology results.                        - Return to my office in 2 weeks. Procedure Code(s):     --- Professional ---                        [phone], Esophagogastroduodenoscopy, flexible,                         transoral; with biopsy, single or multiple Diagnosis Code(s):     --- Professional ---                        R11.2, Nausea with vomiting, unspecified CPT copyright 2019 American Medical Association. All rights reserved. The codes documented in this report are preliminary and upon coder review may  be revised to meet current compliance requirements. Midge Minium MD, MD 01/27/2021 8:25:12 AM This report has been signed electronically. Number of Addenda: 0 Note Initiated On:  01/27/2021 8:04 AM Estimated Blood Loss:  Estimated blood loss: none.      [facility]

## 2021-01-27 NOTE — Anesthesia Postprocedure Evaluation (Signed)
Anesthesia Post Note  Patient: Holly Foley  Procedure(s) Performed: ESOPHAGOGASTRODUODENOSCOPY (EGD) WITH PROPOFOL (N/A )     Patient location during evaluation: PACU Anesthesia Type: General Level of consciousness: awake Pain management: pain level controlled Vital Signs Assessment: post-procedure vital signs reviewed and stable Respiratory status: respiratory function stable Cardiovascular status: stable Postop Assessment: no signs of nausea or vomiting Anesthetic complications: no   No complications documented.  Jola Babinski

## 2021-01-27 NOTE — Transfer of Care (Signed)
Immediate Anesthesia Transfer of Care Note  Patient: Holly Foley  Procedure(s) Performed: ESOPHAGOGASTRODUODENOSCOPY (EGD) WITH PROPOFOL (N/A )  Patient Location: PACU  Anesthesia Type: General  Level of Consciousness: awake, alert  and patient cooperative  Airway and Oxygen Therapy: Patient Spontanous Breathing and Patient connected to supplemental oxygen  Post-op Assessment: Post-op Vital signs reviewed, Patient's Cardiovascular Status Stable, Respiratory Function Stable, Patent Airway and No signs of Nausea or vomiting  Post-op Vital Signs: Reviewed and stable  Complications: No complications documented.

## 2021-01-27 NOTE — Anesthesia Preprocedure Evaluation (Addendum)
Anesthesia Evaluation  Patient identified by MRN, date of birth, ID band Patient awake    Reviewed: Allergy & Precautions, NPO status , Patient's Chart, lab work & pertinent test results  Airway Mallampati: II  TM Distance: >3 FB     Dental   Pulmonary neg recent URI, former smoker,    breath sounds clear to auscultation       Cardiovascular hypertension,  Rhythm:Regular Rate:Normal     Neuro/Psych  Headaches, Depression    GI/Hepatic neg GERD  ,  Endo/Other  diabetes, Type 2Hypothyroidism Obesity - BMI 35  Renal/GU      Musculoskeletal  (+) Arthritis ,   Abdominal   Peds  Hematology   Anesthesia Other Findings   Reproductive/Obstetrics                             Anesthesia Physical Anesthesia Plan  ASA: III  Anesthesia Plan: General   Post-op Pain Management:    Induction: Intravenous  PONV Risk Score and Plan: Propofol infusion, TIVA and Treatment may vary due to age or medical condition  Airway Management Planned: Natural Airway and Nasal Cannula  Additional Equipment:   Intra-op Plan:   Post-operative Plan:   Informed Consent: I have reviewed the patients History and Physical, chart, labs and discussed the procedure including the risks, benefits and alternatives for the proposed anesthesia with the patient or authorized representative who has indicated his/her understanding and acceptance.       Plan Discussed with: CRNA  Anesthesia Plan Comments:        Anesthesia Quick Evaluation

## 2021-01-27 NOTE — Anesthesia Procedure Notes (Signed)
Date/Time: 01/27/2021 8:15 AM Performed by: Lily Kocher, CRNA Pre-anesthesia Checklist: Patient identified, Emergency Drugs available, Suction available, Patient being monitored and Timeout performed Patient Re-evaluated:Patient Re-evaluated prior to induction Placement Confirmation: positive ETCO2

## 2021-01-27 NOTE — H&P (Signed)
Midge Minium, MD [facility] [address] [place], [place] [address] Phone:[phone] Fax : [phone]  Primary Care Physician:  Duanne Limerick, MD Primary Gastroenterologist:  Dr. Servando Snare  Pre-Procedure History & Physical: HPI:  Holly Foley is a 55 y.o. female is here for an endoscopy.   Past Medical History:  Diagnosis Date  . Class 2 obesity with serious comorbidity and body mass index (BMI) of 39.0 to 39.9 in adult 04/15/2017  . Depression   . HLD (hyperlipidemia) 01/31/2016  . Hypertension   . Recurrent major depressive disorder, in partial remission (HCC) 04/15/2017  . Type 2 diabetes mellitus (HCC) 08/24/2014    Past Surgical History:  Procedure Laterality Date  . ABLATION    . CHOLECYSTECTOMY    . COLONOSCOPY WITH PROPOFOL N/A 05/10/2017   Procedure: COLONOSCOPY WITH PROPOFOL;  Surgeon: Midge Minium, MD;  Location: [facility];  Service: Endoscopy;  Laterality: N/A;  . HERNIA REPAIR    . PILONIDAL CYST EXCISION      Prior to Admission medications   Medication Sig Start Date End Date Taking? Authorizing Provider  aspirin EC 81 MG tablet Take 1 tablet by mouth daily.   Yes [provider]  BD PEN NEEDLE NANO U/F 32G X 4 MM MISC  01/14/17  Yes [provider]  busPIRone (BUSPAR) 5 MG tablet Take 1 tablet (5 mg total) by mouth 2 (two) times daily. 01/11/21  Yes Duanne Limerick, MD  Dulaglutide 3 MG/0.5ML SOPN Inject into the skin.    Yes [provider]  empagliflozin (JARDIANCE) 10 MG TABS tablet Take 1 tablet by mouth daily. 04/25/18  Yes [provider]  glimepiride (AMARYL) 2 MG tablet Take 1 tablet by mouth daily. Otelia Sergeant 11/07/18 01/25/21 Yes [provider]  glucose blood test strip Dr Renae Fickle 08/09/14  Yes [provider]  losartan (COZAAR) 50 MG tablet Take 1 tablet (50 mg total) by mouth daily. 01/11/21  Yes Duanne Limerick, MD  metFORMIN (GLUMETZA) 500 MG (MOD) 24 hr tablet Take 1,000 mg by mouth  2 (two) times daily with a meal.   Yes [provider]  sertraline (ZOLOFT) 100 MG tablet Take 1 tablet (100 mg total) by mouth daily. 01/11/21  Yes Duanne Limerick, MD  vitamin B-12 (CYANOCOBALAMIN) 1000 MCG tablet Take 1,000 mcg by mouth daily.   Yes [provider]  pantoprazole (PROTONIX) 40 MG tablet Take 1 tablet (40 mg total) by mouth daily. Patient not taking: Reported on 01/25/2021 11/28/20   Midge Minium, MD    Allergies as of 01/24/2021 - Review Complete 01/11/2021  Allergen Reaction Noted  . Canagliflozin Rash 01/24/2018    Family History  Problem Relation Age of Onset  . Breast cancer Sister 64    Social History   Socioeconomic History  . Marital status: Widowed    Spouse name: Not on file  . Number of children: Not on file  . Years of education: Not on file  . Highest education level: Not on file  Occupational History  . Not on file  Tobacco Use  . Smoking status: Former Smoker    Quit date: 01/25/2009    Years since quitting: 12.0  . Smokeless tobacco: Never Used  Vaping Use  . Vaping Use: Never used  Substance and Sexual Activity  . Alcohol use: Yes    Alcohol/week: 0.0 standard drinks    Comment: occasionally  . Drug use: No  . Sexual activity: Not on file  Other Topics Concern  . Not on file  Social History Narrative  . Not on file   Social Determinants of Health   Financial Resource Strain: Not on file  Food Insecurity: Not on file  Transportation Needs: Not on file  Physical Activity: Not on file  Stress: Not on file  Social Connections: Not on file  Intimate Partner Violence: Not on file    Review of Systems: See HPI, otherwise negative ROS  Physical Exam: BP (!) 141/98   Pulse 77   Temp (!) 97 F (36.1 C) (Temporal)   Ht 5' (1.524 m)   Wt 80.3 kg   LMP 10/08/2011 (Approximate) Comment: No periods since ablation 2012  SpO2 98%   BMI 34.57 kg/m  General:   Alert,  pleasant and cooperative in NAD Head:   Normocephalic and atraumatic. Neck:  Supple; no masses or thyromegaly. Lungs:  Clear throughout to auscultation.    Heart:  Regular rate and rhythm. Abdomen:  Soft, nontender and nondistended. Normal bowel sounds, without guarding, and without rebound.   Neurologic:  Alert and  oriented x4;  grossly normal neurologically.  Impression/Plan: Holly Foley is here for an endoscopy to be performed for nausea and vomiting  Risks, benefits, limitations, and alternatives regarding  endoscopy have been reviewed with the patient.  Questions have been answered.  All parties agreeable.   Midge Minium, MD  01/27/2021, 8:02 AM

## 2021-01-30 ENCOUNTER — Encounter: Payer: Self-pay | Admitting: Gastroenterology

## 2021-01-30 LAB — SURGICAL PATHOLOGY

## 2021-01-31 ENCOUNTER — Encounter: Payer: Self-pay | Admitting: Gastroenterology

## 2021-02-10 LAB — HM DIABETES EYE EXAM

## 2021-02-22 ENCOUNTER — Other Ambulatory Visit: Payer: Self-pay

## 2021-02-22 ENCOUNTER — Encounter: Payer: Self-pay | Admitting: Family Medicine

## 2021-02-22 ENCOUNTER — Ambulatory Visit (INDEPENDENT_AMBULATORY_CARE_PROVIDER_SITE_OTHER): Payer: Managed Care, Other (non HMO) | Admitting: Family Medicine

## 2021-02-22 VITALS — BP 138/86 | HR 68 | Ht 60.0 in | Wt 176.0 lb

## 2021-02-22 DIAGNOSIS — I10 Essential (primary) hypertension: Secondary | ICD-10-CM

## 2021-02-22 DIAGNOSIS — M65331 Trigger finger, right middle finger: Secondary | ICD-10-CM | POA: Diagnosis not present

## 2021-02-22 MED ORDER — HYDROCHLOROTHIAZIDE 12.5 MG PO TABS
12.5000 mg | ORAL_TABLET | Freq: Every day | ORAL | 1 refills | Status: DC
Start: 2021-02-22 — End: 2021-05-16

## 2021-02-22 MED ORDER — LOSARTAN POTASSIUM 50 MG PO TABS: 50.0000 mg | ORAL_TABLET | Freq: Every day | ORAL | 1 refills | Status: DC

## 2021-02-22 NOTE — Patient Instructions (Addendum)
Gastroparesis  Gastroparesis is a condition in which food takes longer than normal to empty from the stomach. This condition is also known as delayed gastric emptying. It is usually a long-term (chronic) condition. There is no cure, but there are treatments and things that you can do at home to help relieve symptoms. Treating the underlying condition that causes gastroparesis can also help relieve symptoms. What are the causes? In many cases, the cause of this condition is not known. Possible causes include:  A hormone (endocrine) disorder, such as hypothyroidism or diabetes.  A nervous system disease, such as Parkinson's disease or multiple sclerosis.  Cancer, infection, or surgery that affects the stomach or vagus nerve. The vagus nerve runs from your chest, through your neck, and to the lower part of your brain.  A connective tissue disorder, such as scleroderma.  Certain medicines. What increases the risk? You are more likely to develop this condition if:  You have certain disorders or diseases. These may include: ? An endocrine disorder. ? An eating disorder. ? Amyloidosis. ? Scleroderma. ? Parkinson's disease. ? Multiple sclerosis. ? Cancer or infection of the stomach or the vagus nerve.  You have had surgery on your stomach or vagus nerve.  You take certain medicines.  You are female. What are the signs or symptoms? Symptoms of this condition include:  Feeling full after eating very little or a loss of appetite.  Nausea, vomiting, or heartburn.  Bloating of your abdomen.  Inconsistent blood sugar (glucose) levels on blood tests.  Unexplained weight loss.  Acid from the stomach coming up into the esophagus (gastroesophageal reflux).  Sudden tightening (spasm) of the stomach, which can be painful. Symptoms may come and go. Some people may not notice any symptoms. How is this diagnosed? This condition is diagnosed with tests, such as:  Tests that check how  long it takes food to move through the stomach and intestines. These tests include: ? Upper gastrointestinal (GI) series. For this test, you drink a liquid that shows up well on X-rays, and then X-rays are taken of your intestines. ? Gastric emptying scintigraphy. For this test, you eat food that contains a small amount of radioactive material, and then scans are taken. ? Wireless capsule GI monitoring system. For this test, you swallow a pill (capsule) that records information about how foods and fluid move through your stomach.  Gastric manometry. For this test, a tube is passed down your throat and into your stomach to measure electrical and muscular activity.  Endoscopy. For this test, a long, thin tube with a camera and light on the end is passed down your throat and into your stomach to check for problems in your stomach lining.  Ultrasound. This test uses sound waves to create images of the inside of your body. This can help rule out gallbladder disease or pancreatitis as a cause of your symptoms. How is this treated? There is no cure for this condition, but treatment and home care may relieve symptoms. Treatment may include:  Treating the underlying cause.  Managing your symptoms by making changes to your diet and exercise habits.  Taking medicines to control nausea and vomiting and to stimulate stomach muscles.  Getting food through a feeding tube in the hospital. This may be done in severe cases.  Having surgery to insert a device called a gastric electrical stimulator into your body. This device helps improve stomach emptying and control nausea and vomiting. Follow these instructions at home:  Take over-the-counter and   prescription medicines only as told by your health care provider.  Follow instructions from your health care provider about eating or drinking restrictions. Your health care provider may recommend that you: ? Eat smaller meals more often. ? Eat low-fat  foods. ? Eat low-fiber forms of high-fiber foods. For example, eat cooked vegetables instead of raw vegetables. ? Have only liquid foods instead of solid foods. Liquid foods are easier to digest.  Drink enough fluid to keep your urine pale yellow.  Exercise as often as told by your health care provider.  Keep all follow-up visits. This is important. Contact a health care provider if you:  Notice that your symptoms do not improve with treatment.  Have new symptoms. Get help right away if you:  Have severe pain in your abdomen that does not improve with treatment.  Have nausea that is severe or does not go away.  Vomit every time you drink fluids. Summary  Gastroparesis is a long-term (chronic) condition in which food takes longer than normal to empty from the stomach.  Symptoms include nausea, vomiting, heartburn, bloating of your abdomen, and loss of appetite.  Eating smaller portions, low-fat foods, and low-fiber forms of high-fiber foods may help you manage your symptoms.  Get help right away if you have severe pain in your abdomen. This information is not intended to replace advice given to you by your health care provider. Make sure you discuss any questions you have with your health care provider. Document Revised: 04/18/2020 Document Reviewed: 04/18/2020 Elsevier Patient Education  2021 Elsevier Inc.  Trigger Finger  Trigger finger, also called stenosing tenosynovitis,  is a condition that causes a finger to get stuck in a bent position. Each finger has a tendon, which is a tough, cord-like tissue that connects muscle to bone, and each tendon passes through a tunnel of tissue called a tendon sheath. To move your finger, your tendon needs to glide freely through the sheath. Trigger finger happens when the tendon or the sheath thickens, making it difficult to move your finger. Trigger finger can affect any finger or a thumb. It may affect more than one finger. Mild cases may  clear up with rest and medicine. Severe cases require more treatment. What are the causes? Trigger finger is caused by a thickened finger tendon or tendon sheath. The cause of this thickening is not known. What increases the risk? The following factors may make you more likely to develop this condition:  Doing activities that require a strong grip.  Having rheumatoid arthritis, gout, or diabetes.  Being 26-33 years old.  Being female. What are the signs or symptoms? Symptoms of this condition include:  Pain when bending or straightening your finger.  Tenderness or swelling where your finger attaches to the palm of your hand.  A lump in the palm of your hand or on the inside of your finger.  Hearing a noise like a pop or a snap when you try to straighten your finger.  Feeling a catching or locking sensation when you try to straighten your finger.  Being unable to straighten your finger. How is this diagnosed? This condition is diagnosed based on your symptoms and a physical exam. How is this treated? This condition may be treated by:  Resting your finger and avoiding activities that make symptoms worse.  Wearing a finger splint to keep your finger extended.  Taking NSAIDs, such as ibuprofen, to relieve pain and swelling.  Doing gentle exercises to stretch the finger as told  by your health care provider.  Having medicine that reduces swelling and inflammation (steroids) injected into the tendon sheath. Injections may need to be repeated.  Having surgery to open the tendon sheath. This may be done if other treatments do not work and you cannot straighten your finger. You may need physical therapy after surgery. Follow these instructions at home: If you have a splint:  Wear the splint as told by your health care provider. Remove it only as told by your health care provider.  Loosen it if your fingers tingle, become numb, or turn cold and blue.  Keep it clean.  If the  splint is not waterproof: ? Do not let it get wet. ? Cover it with a watertight covering when you take a bath or shower. Managing pain, stiffness, and swelling If directed, apply heat to the affected area as often as told by your health care provider. Use the heat source that your health care provider recommends, such as a moist heat pack or a heating pad.  Place a towel between your skin and the heat source.  Leave the heat on for 20-30 minutes.  Remove the heat if your skin turns bright red. This is especially important if you are unable to feel pain, heat, or cold. You may have a greater risk of getting burned. If directed, put ice on the painful area. To do this:  If you have a removable splint, remove it as told by your health care provider.  Put ice in a plastic bag.  Place a towel between your skin and the bag or between your splint and the bag.  Leave the ice on for 20 minutes, 2-3 times a day.      Activity  Rest your finger as told by your health care provider. Avoid activities that make the pain worse.  Return to your normal activities as told by your health care provider. Ask your health care provider what activities are safe for you.  Do exercises as told by your health care provider.  Ask your health care provider when it is safe to drive if you have a splint on your hand. General instructions  Take over-the-counter and prescription medicines only as told by your health care provider.  Keep all follow-up visits as told by your health care provider. This is important. Contact a health care provider if:  Your symptoms are not improving with home care. Summary  Trigger finger, also called stenosing tenosynovitis, causes your finger to get stuck in a bent position. This can make it difficult and painful to straighten your finger.  This condition develops when a finger tendon or tendon sheath thickens.  Treatment may include resting your finger, wearing a splint,  and taking medicines.  In severe cases, surgery to open the tendon sheath may be needed. This information is not intended to replace advice given to you by your health care provider. Make sure you discuss any questions you have with your health care provider. Document Revised: 04/27/2019 Document Reviewed: 04/27/2019 Elsevier Patient Education  2021 [employer].

## 2021-02-22 NOTE — Progress Notes (Signed)
Date:  02/22/2021   Name:  Holly Foley   DOB:  01/24/1966   MRN:  [id number]   Chief Complaint: Hypertension (Added losartan)  Hypertension This is a chronic problem. The current episode started more than 1 year ago. The problem has been gradually improving since onset. The problem is uncontrolled. Pertinent negatives include no anxiety, blurred vision, chest pain, headaches, malaise/fatigue, neck pain, orthopnea, palpitations, peripheral edema, PND, shortness of breath or sweats. There are no associated agents to hypertension. Risk factors for coronary artery disease include diabetes mellitus and dyslipidemia. Past treatments include angiotensin blockers. The current treatment provides mild improvement. There are no compliance problems.  There is no history of angina, kidney disease, CAD/MI, CVA, heart failure, left ventricular hypertrophy, PVD or retinopathy. There is no history of chronic renal disease, a hypertension causing med or renovascular disease.    Lab Results  Component Value Date   CREATININE 0.80 10/05/2020   BUN 11 10/05/2020   NA 138 10/05/2020   K 4.1 10/05/2020   CL 98 10/05/2020   CO2 24 10/05/2020   Lab Results  Component Value Date   CHOL 131 04/15/2017   HDL 45 04/15/2017   LDLCALC 67 04/15/2017   TRIG 93 04/15/2017   CHOLHDL 2.9 04/15/2017   Lab Results  Component Value Date   TSH 1.450 10/05/2020   Lab Results  Component Value Date   HGBA1C 7.3 01/10/2021   Lab Results  Component Value Date   WBC 11.2 (H) 10/05/2020   HGB 16.8 (H) 10/05/2020   HCT 48.8 (H) 10/05/2020   MCV 83 10/05/2020   PLT 277 10/05/2020   Lab Results  Component Value Date   ALT 16 10/05/2020   AST 22 10/05/2020   ALKPHOS 105 10/05/2020   BILITOT 0.6 10/05/2020     Review of Systems  Constitutional: Negative.  Negative for chills, fatigue, fever, malaise/fatigue and unexpected weight change.  HENT: Negative for congestion, ear discharge, ear pain, rhinorrhea,  sinus pressure, sneezing and sore throat.   Eyes: Negative for blurred vision, double vision, photophobia, pain, discharge, redness and itching.  Respiratory: Negative for cough, shortness of breath, wheezing and stridor.   Cardiovascular: Negative for chest pain, palpitations, orthopnea and PND.  Gastrointestinal: Negative for abdominal pain, blood in stool, constipation, diarrhea, nausea and vomiting.  Endocrine: Negative for cold intolerance, heat intolerance, polydipsia, polyphagia and polyuria.  Genitourinary: Negative for dysuria, flank pain, frequency, hematuria, menstrual problem, pelvic pain, urgency, vaginal bleeding and vaginal discharge.  Musculoskeletal: Negative for arthralgias, back pain, myalgias and neck pain.  Skin: Negative for rash.  Allergic/Immunologic: Negative for environmental allergies and food allergies.  Neurological: Negative for dizziness, weakness, light-headedness, numbness and headaches.  Hematological: Negative for adenopathy. Does not bruise/bleed easily.  Psychiatric/Behavioral: Negative for dysphoric mood. The patient is not nervous/anxious.     Patient Active Problem List   Diagnosis Date Noted  . Intractable vomiting   . Special screening for malignant neoplasms, colon   . Recurrent major depressive disorder, in partial remission (HCC) 04/15/2017  . Reactive depression 04/15/2017  . Colon cancer screening 04/15/2017  . Class 2 obesity with serious comorbidity and body mass index (BMI) of 39.0 to 39.9 in adult 04/15/2017  . Recurrent ventral incisional hernia 07/03/2016  . Periumbilical abdominal pain 06/25/2016  . HLD (hyperlipidemia) 01/31/2016  . Essential hypertension 01/31/2016  . Near syncope 01/31/2016  . Fast heart beat 01/31/2016  . B12 deficiency 08/31/2014  . Diabetes mellitus (HCC) 08/31/2014  .  Type 2 diabetes mellitus (HCC) 08/24/2014    Allergies  Allergen Reactions  . Canagliflozin Rash    Past Surgical History:  Procedure  Laterality Date  . ABLATION    . CHOLECYSTECTOMY    . COLONOSCOPY WITH PROPOFOL N/A 05/10/2017   Procedure: COLONOSCOPY WITH PROPOFOL;  Surgeon: Midge Minium, MD;  Location: [facility];  Service: Endoscopy;  Laterality: N/A;  . ESOPHAGOGASTRODUODENOSCOPY (EGD) WITH PROPOFOL N/A 01/27/2021   Procedure: ESOPHAGOGASTRODUODENOSCOPY (EGD) WITH PROPOFOL;  Surgeon: Midge Minium, MD;  Location: [facility];  Service: Endoscopy;  Laterality: N/A;  Diabetic - oral meds  . HERNIA REPAIR    . PILONIDAL CYST EXCISION      Social History   Tobacco Use  . Smoking status: Former Smoker    Quit date: 01/25/2009    Years since quitting: 12.0  . Smokeless tobacco: Never Used  Vaping Use  . Vaping Use: Never used  Substance Use Topics  . Alcohol use: Yes    Alcohol/week: 0.0 standard drinks    Comment: occasionally  . Drug use: No     Medication list has been reviewed and updated.  Current Meds  Medication Sig  . aspirin EC 81 MG tablet Take 1 tablet by mouth daily.  . BD PEN NEEDLE NANO U/F 32G X 4 MM MISC   . busPIRone (BUSPAR) 5 MG tablet Take 1 tablet (5 mg total) by mouth 2 (two) times daily.  . Dulaglutide 3 MG/0.5ML SOPN Inject into the skin.   Marland Kitchen empagliflozin (JARDIANCE) 10 MG TABS tablet Take 1 tablet by mouth daily.  Marland Kitchen glimepiride (AMARYL) 2 MG tablet Take 1 tablet by mouth daily. [employer]  . glucose blood test strip Dr Renae Fickle  . losartan (COZAAR) 50 MG tablet Take 1 tablet (50 mg total) by mouth daily.  . metFORMIN (GLUMETZA) 500 MG (MOD) 24 hr tablet Take 1,000 mg by mouth 2 (two) times daily with a meal.  . sertraline (ZOLOFT) 100 MG tablet Take 1 tablet (100 mg total) by mouth daily.  . vitamin B-12 (CYANOCOBALAMIN) 1000 MCG tablet Take 1,000 mcg by mouth daily.    PHQ 2/9 Scores 01/11/2021 10/05/2020 07/11/2020 10/14/2019  PHQ - 2 Score 0 0 0 4  PHQ- 9 Score 0 8 0 12    GAD 7 : Generalized Anxiety Score 01/11/2021 10/05/2020 07/11/2020  Nervous, Anxious,  on Edge 0 0 2  Control/stop worrying 0 1 0  Worry too much - different things 0 0 0  Trouble relaxing 0 0 0  Restless 0 0 0  Easily annoyed or irritable 0 2 0  Afraid - awful might happen 0 1 0  Total GAD 7 Score 0 4 2  Anxiety Difficulty - Not difficult at all Somewhat difficult    BP Readings from Last 3 Encounters:  02/22/21 (!) 138/92  01/27/21 135/74  01/11/21 (!) 144/110    Physical Exam  Wt Readings from Last 3 Encounters:  02/22/21 176 lb (79.8 kg)  01/27/21 177 lb (80.3 kg)  01/11/21 177 lb (80.3 kg)    BP (!) 138/92   Pulse 68   Ht 5' (1.524 m)   Wt 176 lb (79.8 kg)   BMI 34.37 kg/m   Assessment and Plan:  1. Essential hypertension Chronic.  Controlled.  Stable.  Patient will continue losartan 50 mg once a day but we will add hydrochlorothiazide 12.5 mg once a day.  We will recheck patient in 3 months. - losartan (COZAAR) 50 MG tablet; Take  1 tablet (50 mg total) by mouth daily.  Dispense: 90 tablet; Refill: 1  2. Trigger middle finger of right hand New onset.  Persistent.  Episodic.  Patient is having episodes in which her middle right finger locks into flexion with a bit of a valgus angle.  There is tenderness over the flexor tendon consistent with a trigger finger.  This was discussed with sports medicine/Dr. Ashley Royalty.  Patient will be referred to sports medicine clinic in the near future.  Formation given - hydrochlorothiazide (HYDRODIURIL) 12.5 MG tablet; Take 1 tablet (12.5 mg total) by mouth daily.  Dispense: 90 tablet; Refill: 1

## 2021-02-27 ENCOUNTER — Other Ambulatory Visit: Payer: Self-pay

## 2021-02-27 ENCOUNTER — Ambulatory Visit (INDEPENDENT_AMBULATORY_CARE_PROVIDER_SITE_OTHER): Payer: Managed Care, Other (non HMO) | Admitting: Gastroenterology

## 2021-02-27 ENCOUNTER — Encounter: Payer: Self-pay | Admitting: Gastroenterology

## 2021-02-27 VITALS — BP 119/78 | HR 108 | Temp 97.4°F | Ht 60.0 in | Wt 175.0 lb

## 2021-02-27 DIAGNOSIS — R112 Nausea with vomiting, unspecified: Secondary | ICD-10-CM | POA: Diagnosis not present

## 2021-02-27 MED ORDER — METOCLOPRAMIDE HCL 5 MG PO TABS: 5.0000 mg | ORAL_TABLET | Freq: Three times a day (TID) | ORAL | 0 refills | Status: DC

## 2021-02-27 NOTE — Progress Notes (Signed)
Primary Care Physician: Duanne Limerick, MD  Primary Gastroenterologist:  Dr. Midge Minium  Chief Complaint  Patient presents with  . Follow-up    HPI: Holly Foley is a 55 y.o. female here after seeing me for vomiting and weight loss.  The patient was set up for an upper endoscopy to look for source of her nausea and vomiting and to see if there is any signs of reflux disease.  The patient was also set up for a gastric emptying study but could not keep the food down and could not complete the gastric emptying study.  During the upper endoscopy she was found to have a small hiatal hernia and was also noted to have retained food in the stomach.  The patient does suffer from diabetes.  Her weight in December was 179 pounds.  The patient was also started on a PPI at the last visit.  The patient reports that she is now 175 pounds.  Past Medical History:  Diagnosis Date  . Class 2 obesity with serious comorbidity and body mass index (BMI) of 39.0 to 39.9 in adult 04/15/2017  . Depression   . HLD (hyperlipidemia) 01/31/2016  . Hypertension   . Recurrent major depressive disorder, in partial remission (HCC) 04/15/2017  . Type 2 diabetes mellitus (HCC) 08/24/2014    Current Outpatient Medications  Medication Sig Dispense Refill  . aspirin EC 81 MG tablet Take 1 tablet by mouth daily.    . BD PEN NEEDLE NANO U/F 32G X 4 MM MISC   0  . busPIRone (BUSPAR) 5 MG tablet Take 1 tablet (5 mg total) by mouth 2 (two) times daily. 60 tablet 5  . Dulaglutide 3 MG/0.5ML SOPN Inject into the skin.     Marland Kitchen empagliflozin (JARDIANCE) 10 MG TABS tablet Take 1 tablet by mouth daily.    . hydrochlorothiazide (HYDRODIURIL) 12.5 MG tablet Take 1 tablet (12.5 mg total) by mouth daily. 90 tablet 1  . losartan (COZAAR) 50 MG tablet Take 1 tablet (50 mg total) by mouth daily. 90 tablet 1  . metFORMIN (GLUMETZA) 500 MG (MOD) 24 hr tablet Take 1,000 mg by mouth 2 (two) times daily with a meal.    . pantoprazole (PROTONIX)  40 MG tablet Take 1 tablet (40 mg total) by mouth daily. 30 tablet 6  . sertraline (ZOLOFT) 100 MG tablet Take 1 tablet (100 mg total) by mouth daily. 90 tablet 1  . vitamin B-12 (CYANOCOBALAMIN) 1000 MCG tablet Take 1,000 mcg by mouth daily.    Marland Kitchen glimepiride (AMARYL) 2 MG tablet Take 1 tablet by mouth daily. [employer]    . glucose blood test strip Dr Renae Fickle    . metoCLOPramide (REGLAN) 5 MG tablet Take 1 tablet (5 mg total) by mouth 3 (three) times daily before meals. 90 tablet 0   No current facility-administered medications for this visit.    Allergies as of 02/27/2021 - Review Complete 02/27/2021  Allergen Reaction Noted  . Canagliflozin Rash 01/24/2018    ROS:  General: Negative for anorexia, weight loss, fever, chills, fatigue, weakness. ENT: Negative for hoarseness, difficulty swallowing , nasal congestion. CV: Negative for chest pain, angina, palpitations, dyspnea on exertion, peripheral edema.  Respiratory: Negative for dyspnea at rest, dyspnea on exertion, cough, sputum, wheezing.  GI: See history of present illness. GU:  Negative for dysuria, hematuria, urinary incontinence, urinary frequency, nocturnal urination.  Endo: Negative for unusual weight change.    Physical Examination:   BP 119/78  Pulse (!) 108   Temp (!) 97.4 F (36.3 C) (Temporal)   Ht 5' (1.524 m)   Wt 175 lb (79.4 kg)   BMI 34.18 kg/m   General: Well-nourished, well-developed in no acute distress.  Eyes: No icterus. Conjunctivae pink. Extremities: No lower extremity edema. No clubbing or deformities. Neuro: Alert and oriented x 3.  Grossly intact. Skin: Warm and dry, no jaundice.   Psych: Alert and cooperative, normal mood and affect.  Labs:    Imaging Studies: No results found.  Assessment and Plan:   Holly Foley is a 55 y.o. y/o female who has vomiting and retained food in her stomach.  The patient was sent for a gastric emptying study with inability to keep the barium meal  down.  She has a history of diabetes and likely has gastroparesis.  The patient will be tried on a trial of Reglan 5 mg before each meal to see if this improves her symptoms.  The patient has been explained the risks including tardive dyskinesia and that it can be temporary or permanent.  The patient has been explained the plan and agrees with it.     Midge Minium, MD. Clementeen Graham    Note: This dictation was prepared with Dragon dictation along with smaller phrase technology. Any transcriptional errors that result from this process are unintentional.

## 2021-04-03 ENCOUNTER — Other Ambulatory Visit: Payer: Self-pay | Admitting: Gastroenterology

## 2021-04-05 ENCOUNTER — Other Ambulatory Visit: Payer: Self-pay

## 2021-04-06 LAB — LIPID PANEL
Cholesterol: 147 (ref 0–200)
HDL: 47 (ref 35–70)
LDL Cholesterol: 82
Triglycerides: 87 (ref 40–160)

## 2021-04-06 LAB — HM DIABETES FOOT EXAM: HM Diabetic Foot Exam: NORMAL

## 2021-04-06 LAB — MICROALBUMIN, URINE: Microalb, Ur: 54.7

## 2021-05-16 ENCOUNTER — Other Ambulatory Visit: Payer: Self-pay

## 2021-05-16 ENCOUNTER — Ambulatory Visit (INDEPENDENT_AMBULATORY_CARE_PROVIDER_SITE_OTHER): Payer: Managed Care, Other (non HMO) | Admitting: Family Medicine

## 2021-05-16 ENCOUNTER — Encounter: Payer: Self-pay | Admitting: Family Medicine

## 2021-05-16 VITALS — BP 120/80 | HR 88 | Ht 60.0 in | Wt 175.0 lb

## 2021-05-16 DIAGNOSIS — I10 Essential (primary) hypertension: Secondary | ICD-10-CM | POA: Diagnosis not present

## 2021-05-16 DIAGNOSIS — F329 Major depressive disorder, single episode, unspecified: Secondary | ICD-10-CM

## 2021-05-16 DIAGNOSIS — F419 Anxiety disorder, unspecified: Secondary | ICD-10-CM | POA: Diagnosis not present

## 2021-05-16 DIAGNOSIS — Z6834 Body mass index (BMI) 34.0-34.9, adult: Secondary | ICD-10-CM

## 2021-05-16 MED ORDER — LOSARTAN POTASSIUM 50 MG PO TABS: 50.0000 mg | ORAL_TABLET | Freq: Every day | ORAL | 1 refills | Status: DC

## 2021-05-16 MED ORDER — HYDROCHLOROTHIAZIDE 12.5 MG PO TABS: 12.5000 mg | ORAL_TABLET | Freq: Every day | ORAL | 1 refills | Status: DC

## 2021-05-16 MED ORDER — SERTRALINE HCL 50 MG PO TABS: 50.0000 mg | ORAL_TABLET | Freq: Every day | ORAL | 3 refills | Status: DC

## 2021-05-16 MED ORDER — BUSPIRONE HCL 5 MG PO TABS: 5.0000 mg | ORAL_TABLET | Freq: Two times a day (BID) | ORAL | 5 refills | Status: DC

## 2021-05-16 NOTE — Patient Instructions (Signed)

## 2021-05-16 NOTE — Progress Notes (Signed)
Date:  05/16/2021   Name:  Holly Foley   DOB:  May 20, 1966   MRN:  [id number]   Chief Complaint: Depression (Wants to come down on one of the meds) and Hypertension  Depression        This is a chronic problem.  The current episode started more than 1 year ago.   The onset quality is sudden.   The problem occurs daily.  The problem has been gradually improving since onset.  Associated symptoms include no decreased concentration, no fatigue, no helplessness, no hopelessness, does not have insomnia, not irritable, no restlessness, no decreased interest, no appetite change, no body aches, no myalgias, no headaches, no indigestion, not sad and no suicidal ideas.     The symptoms are aggravated by nothing.  Past treatments include SSRIs - Selective serotonin reuptake inhibitors.  Compliance with treatment is good.  Past compliance problems include medication issues.  Previous treatment provided moderate relief.   Pertinent negatives include no anxiety. Hypertension This is a chronic problem. The current episode started more than 1 year ago. The problem has been gradually improving since onset. The problem is controlled. Pertinent negatives include no anxiety, blurred vision, chest pain, headaches, malaise/fatigue, neck pain, orthopnea, palpitations, peripheral edema, PND, shortness of breath or sweats. Past treatments include diuretics and angiotensin blockers. The current treatment provides moderate improvement. There are no compliance problems.  There is no history of angina, kidney disease, CAD/MI, CVA, heart failure, left ventricular hypertrophy, PVD or retinopathy. There is no history of chronic renal disease, a hypertension causing med or renovascular disease.    Lab Results  Component Value Date   CREATININE 0.80 10/05/2020   BUN 11 10/05/2020   NA 138 10/05/2020   K 4.1 10/05/2020   CL 98 10/05/2020   CO2 24 10/05/2020   Lab Results  Component Value Date   CHOL 131 04/15/2017   HDL  45 04/15/2017   LDLCALC 67 04/15/2017   TRIG 93 04/15/2017   CHOLHDL 2.9 04/15/2017   Lab Results  Component Value Date   TSH 1.450 10/05/2020   Lab Results  Component Value Date   HGBA1C 7.3 01/10/2021   Lab Results  Component Value Date   WBC 11.2 (H) 10/05/2020   HGB 16.8 (H) 10/05/2020   HCT 48.8 (H) 10/05/2020   MCV 83 10/05/2020   PLT 277 10/05/2020   Lab Results  Component Value Date   ALT 16 10/05/2020   AST 22 10/05/2020   ALKPHOS 105 10/05/2020   BILITOT 0.6 10/05/2020     Review of Systems  Constitutional: Negative for appetite change, chills, fatigue, fever and malaise/fatigue.  HENT: Negative for drooling, ear discharge, ear pain and sore throat.   Eyes: Negative for blurred vision.  Respiratory: Negative for cough, shortness of breath and wheezing.   Cardiovascular: Negative for chest pain, palpitations, orthopnea, leg swelling and PND.  Gastrointestinal: Negative for abdominal pain, blood in stool, constipation, diarrhea and nausea.  Endocrine: Negative for polydipsia.  Genitourinary: Negative for dysuria, frequency, hematuria and urgency.  Musculoskeletal: Negative for back pain, myalgias and neck pain.  Skin: Negative for rash.  Allergic/Immunologic: Negative for environmental allergies.  Neurological: Negative for dizziness and headaches.  Hematological: Does not bruise/bleed easily.  Psychiatric/Behavioral: Positive for depression. Negative for decreased concentration and suicidal ideas. The patient is not nervous/anxious and does not have insomnia.     Patient Active Problem List   Diagnosis Date Noted  . Intractable vomiting   .  Special screening for malignant neoplasms, colon   . Recurrent major depressive disorder, in partial remission (HCC) 04/15/2017  . Reactive depression 04/15/2017  . Colon cancer screening 04/15/2017  . Class 2 obesity with serious comorbidity and body mass index (BMI) of 39.0 to 39.9 in adult 04/15/2017  . Recurrent  ventral incisional hernia 07/03/2016  . Periumbilical abdominal pain 06/25/2016  . HLD (hyperlipidemia) 01/31/2016  . Essential hypertension 01/31/2016  . Near syncope 01/31/2016  . Fast heart beat 01/31/2016  . B12 deficiency 08/31/2014  . Diabetes mellitus (HCC) 08/31/2014  . Type 2 diabetes mellitus (HCC) 08/24/2014    Allergies  Allergen Reactions  . Canagliflozin Rash    Past Surgical History:  Procedure Laterality Date  . ABLATION    . CHOLECYSTECTOMY    . COLONOSCOPY WITH PROPOFOL N/A 05/10/2017   Procedure: COLONOSCOPY WITH PROPOFOL;  Surgeon: Midge Minium, MD;  Location: [facility];  Service: Endoscopy;  Laterality: N/A;  . ESOPHAGOGASTRODUODENOSCOPY (EGD) WITH PROPOFOL N/A 01/27/2021   Procedure: ESOPHAGOGASTRODUODENOSCOPY (EGD) WITH PROPOFOL;  Surgeon: Midge Minium, MD;  Location: [facility];  Service: Endoscopy;  Laterality: N/A;  Diabetic - oral meds  . HERNIA REPAIR    . PILONIDAL CYST EXCISION      Social History   Tobacco Use  . Smoking status: Former Smoker    Quit date: 01/25/2009    Years since quitting: 12.3  . Smokeless tobacco: Never Used  Vaping Use  . Vaping Use: Never used  Substance Use Topics  . Alcohol use: Yes    Alcohol/week: 0.0 standard drinks    Comment: occasionally  . Drug use: No     Medication list has been reviewed and updated.  Current Meds  Medication Sig  . aspirin EC 81 MG tablet Take 1 tablet by mouth daily.  . BD PEN NEEDLE NANO U/F 32G X 4 MM MISC   . busPIRone (BUSPAR) 5 MG tablet Take 1 tablet (5 mg total) by mouth 2 (two) times daily.  . Dulaglutide 3 MG/0.5ML SOPN Inject into the skin.   Marland Kitchen empagliflozin (JARDIANCE) 10 MG TABS tablet Take 1 tablet by mouth daily.  Marland Kitchen glimepiride (AMARYL) 2 MG tablet Take 1 tablet by mouth daily. [employer]  . hydrochlorothiazide (HYDRODIURIL) 12.5 MG tablet Take 1 tablet (12.5 mg total) by mouth daily.  Marland Kitchen losartan (COZAAR) 50 MG tablet Take 1 tablet (50 mg  total) by mouth daily.  . metFORMIN (GLUMETZA) 500 MG (MOD) 24 hr tablet Take 1,000 mg by mouth 2 (two) times daily with a meal.  . sertraline (ZOLOFT) 100 MG tablet Take 1 tablet (100 mg total) by mouth daily.  . vitamin B-12 (CYANOCOBALAMIN) 1000 MCG tablet Take 1,000 mcg by mouth daily.  . [DISCONTINUED] metoCLOPramide (REGLAN) 5 MG tablet Take 1 tablet (5 mg total) by mouth 3 (three) times daily before meals.    PHQ 2/9 Scores 05/16/2021 01/11/2021 10/05/2020 07/11/2020  PHQ - 2 Score 0 0 0 0  PHQ- 9 Score 0 0 8 0    GAD 7 : Generalized Anxiety Score 05/16/2021 01/11/2021 10/05/2020 07/11/2020  Nervous, Anxious, on Edge 0 0 0 2  Control/stop worrying 0 0 1 0  Worry too much - different things 0 0 0 0  Trouble relaxing 0 0 0 0  Restless 0 0 0 0  Easily annoyed or irritable 0 0 2 0  Afraid - awful might happen 0 0 1 0  Total GAD 7 Score 0 0 4 2  Anxiety  Difficulty - - Not difficult at all Somewhat difficult    BP Readings from Last 3 Encounters:  05/16/21 120/80  02/27/21 119/78  02/22/21 138/86    Physical Exam Vitals and nursing note reviewed.  Constitutional:      General: She is not irritable.    Appearance: She is well-developed.  HENT:     Head: Normocephalic.     Right Ear: Tympanic membrane, ear canal and external ear normal.     Left Ear: Tympanic membrane, ear canal and external ear normal.     Nose: Nose normal. No congestion or rhinorrhea.     Mouth/Throat:     Mouth: Mucous membranes are moist.  Eyes:     General: Lids are everted, no foreign bodies appreciated. No scleral icterus.       Right eye: No discharge.        Left eye: No foreign body, discharge or hordeolum.     Conjunctiva/sclera: Conjunctivae normal.     Right eye: Right conjunctiva is not injected.     Left eye: Left conjunctiva is not injected.     Pupils: Pupils are equal, round, and reactive to light.  Neck:     Thyroid: No thyromegaly.     Vascular: No JVD.     Trachea: No tracheal  deviation.  Cardiovascular:     Rate and Rhythm: Normal rate and regular rhythm.     Heart sounds: Normal heart sounds. No murmur heard. No friction rub. No gallop.   Pulmonary:     Effort: Pulmonary effort is normal. No respiratory distress.     Breath sounds: Normal breath sounds. No wheezing, rhonchi or rales.  Abdominal:     General: Bowel sounds are normal.     Palpations: Abdomen is soft. There is no mass.     Tenderness: There is no abdominal tenderness. There is no guarding or rebound.  Musculoskeletal:        General: No tenderness. Normal range of motion.     Cervical back: Normal range of motion and neck supple.  Lymphadenopathy:     Cervical: No cervical adenopathy.  Skin:    General: Skin is warm.     Findings: No rash.  Neurological:     Mental Status: She is alert and oriented to person, place, and time.     Cranial Nerves: No cranial nerve deficit.     Deep Tendon Reflexes: Reflexes normal.  Psychiatric:        Mood and Affect: Mood is not anxious or depressed.     Wt Readings from Last 3 Encounters:  05/16/21 175 lb (79.4 kg)  02/27/21 175 lb (79.4 kg)  02/22/21 176 lb (79.8 kg)    BP 120/80   Pulse 88   Ht 5' (1.524 m)   Wt 175 lb (79.4 kg)   BMI 34.18 kg/m   Assessment and Plan: 1. Essential hypertension Chronic.  Controlled.  Stable.  Blood pressure today is 120/80.  Continue losartan 50 mg and hydrochlorothiazide 12.5 mg once a day.  We will recheck in 6 months. - hydrochlorothiazide (HYDRODIURIL) 12.5 MG tablet; Take 1 tablet (12.5 mg total) by mouth daily.  Dispense: 90 tablet; Refill: 1 - losartan (COZAAR) 50 MG tablet; Take 1 tablet (50 mg total) by mouth daily.  Dispense: 90 tablet; Refill: 1  2. Reactive depression Chronic.  Controlled.  Stable.  PHQ score 0.  Patient would like to see about decreasing her dosing of medication.  We will start by  reducing her sertraline to 75 mg which would be 1-1/2 tablets of the 50 mg dosage.  We will do so  for 2 weeks and then decrease to 50.  Patient will return in 6 months.  If he needs to remain at 75 mg contact me and I will adjust dosage amount accordingly.  3. Anxiety Chronic.  Controlled.  Stable.  Continue buspirone 5 mg twice a day. - busPIRone (BUSPAR) 5 MG tablet; Take 1 tablet (5 mg total) by mouth 2 (two) times daily.  Dispense: 60 tablet; Refill: 5  4. BMI 34.0-34.9,adult Patient is doing excellent with weight loss from the 39 range to currently at 34.  She is currently putting in a swimming pool at her own and is looking at water aerobics and other means of continue exercise and continue to watch her diet.  Health risks of being over weight were discussed and patient was counseled on weight loss options and exercise.

## 2021-10-31 ENCOUNTER — Ambulatory Visit (INDEPENDENT_AMBULATORY_CARE_PROVIDER_SITE_OTHER): Payer: Managed Care, Other (non HMO) | Admitting: Family Medicine

## 2021-10-31 ENCOUNTER — Encounter: Payer: Self-pay | Admitting: Family Medicine

## 2021-10-31 ENCOUNTER — Other Ambulatory Visit: Payer: Self-pay

## 2021-10-31 VITALS — BP 120/68 | HR 72 | Ht 60.0 in | Wt 180.0 lb

## 2021-10-31 DIAGNOSIS — F329 Major depressive disorder, single episode, unspecified: Secondary | ICD-10-CM | POA: Diagnosis not present

## 2021-10-31 DIAGNOSIS — F419 Anxiety disorder, unspecified: Secondary | ICD-10-CM | POA: Diagnosis not present

## 2021-10-31 DIAGNOSIS — I10 Essential (primary) hypertension: Secondary | ICD-10-CM | POA: Diagnosis not present

## 2021-10-31 LAB — HEMOGLOBIN A1C: Hemoglobin A1C: 7.3

## 2021-10-31 MED ORDER — LOSARTAN POTASSIUM 50 MG PO TABS: 50.0000 mg | ORAL_TABLET | Freq: Every day | ORAL | 1 refills | Status: DC

## 2021-10-31 MED ORDER — BUSPIRONE HCL 5 MG PO TABS: 5.0000 mg | ORAL_TABLET | Freq: Two times a day (BID) | ORAL | 1 refills | Status: DC

## 2021-10-31 MED ORDER — SERTRALINE HCL 50 MG PO TABS: 50.0000 mg | ORAL_TABLET | Freq: Every day | ORAL | 1 refills | Status: DC

## 2021-10-31 MED ORDER — HYDROCHLOROTHIAZIDE 12.5 MG PO TABS
12.5000 mg | ORAL_TABLET | Freq: Every day | ORAL | 1 refills | Status: DC
Start: 2021-10-31 — End: 2022-05-01

## 2021-10-31 NOTE — Progress Notes (Signed)
Date:  10/31/2021   Name:  Holly Foley   DOB:  10/26/1966   MRN:  [id number]   Chief Complaint: Depression, Hypertension, and Anxiety  Depression        This is a chronic problem.  The current episode started more than 1 year ago.   The onset quality is sudden.   The problem occurs intermittently.  Associated symptoms include no decreased concentration, no fatigue, no helplessness, no hopelessness, does not have insomnia, not irritable, no restlessness, no decreased interest, no appetite change, no body aches, no myalgias, no headaches, no indigestion, not sad and no suicidal ideas.     The symptoms are aggravated by nothing.  Past treatments include SSRIs - Selective serotonin reuptake inhibitors.  Compliance with treatment is good.  Previous treatment provided moderate relief.  Past medical history includes anxiety.   Hypertension This is a chronic problem. The current episode started more than 1 year ago. The problem has been gradually improving since onset. The problem is controlled. Associated symptoms include anxiety. Pertinent negatives include no chest pain, headaches, neck pain, orthopnea, palpitations, PND or shortness of breath. Risk factors for coronary artery disease include dyslipidemia. Past treatments include angiotensin blockers and diuretics. The current treatment provides moderate improvement. There are no compliance problems.  There is no history of angina, kidney disease, CAD/MI, CVA, heart failure, left ventricular hypertrophy, PVD or retinopathy. There is no history of chronic renal disease, a hypertension causing med or renovascular disease.  Anxiety Presents for follow-up visit. Patient reports no chest pain, decreased concentration, dizziness, excessive worry, insomnia, irritability, nausea, nervous/anxious behavior, palpitations, panic, restlessness, shortness of breath or suicidal ideas.     Lab Results  Component Value Date   CREATININE 0.80 10/05/2020   BUN 11  10/05/2020   NA 138 10/05/2020   K 4.1 10/05/2020   CL 98 10/05/2020   CO2 24 10/05/2020   Lab Results  Component Value Date   CHOL 147 04/06/2021   HDL 47 04/06/2021   LDLCALC 82 04/06/2021   TRIG 87 04/06/2021   CHOLHDL 2.9 04/15/2017   Lab Results  Component Value Date   TSH 1.450 10/05/2020   Lab Results  Component Value Date   HGBA1C 7.3 10/31/2021   Lab Results  Component Value Date   WBC 11.2 (H) 10/05/2020   HGB 16.8 (H) 10/05/2020   HCT 48.8 (H) 10/05/2020   MCV 83 10/05/2020   PLT 277 10/05/2020   Lab Results  Component Value Date   ALT 16 10/05/2020   AST 22 10/05/2020   ALKPHOS 105 10/05/2020   BILITOT 0.6 10/05/2020     Review of Systems  Constitutional:  Negative for appetite change, chills, fatigue, fever and irritability.  HENT:  Negative for drooling, ear discharge, ear pain and sore throat.   Respiratory:  Negative for cough, shortness of breath and wheezing.   Cardiovascular:  Negative for chest pain, palpitations, orthopnea, leg swelling and PND.  Gastrointestinal:  Negative for abdominal pain, blood in stool, constipation, diarrhea and nausea.  Endocrine: Negative for polydipsia.  Genitourinary:  Negative for dysuria, frequency, hematuria and urgency.  Musculoskeletal:  Negative for back pain, myalgias and neck pain.  Skin:  Negative for rash.  Allergic/Immunologic: Negative for environmental allergies.  Neurological:  Negative for dizziness and headaches.  Hematological:  Does not bruise/bleed easily.  Psychiatric/Behavioral:  Positive for depression. Negative for decreased concentration and suicidal ideas. The patient is not nervous/anxious and does not have insomnia.  Patient Active Problem List   Diagnosis Date Noted   Intractable vomiting    Special screening for malignant neoplasms, colon    Recurrent major depressive disorder, in partial remission ([place]) 04/15/2017   Reactive depression 04/15/2017   Colon cancer screening  04/15/2017   Class 2 obesity with serious comorbidity and body mass index (BMI) of 39.0 to 39.9 in adult 04/15/2017   Recurrent ventral incisional hernia [id number]   Periumbilical abdominal pain 06/25/2016   HLD (hyperlipidemia) 01/31/2016   Essential hypertension 01/31/2016   Near syncope 01/31/2016   Fast heart beat 01/31/2016   B12 deficiency 08/31/2014   Diabetes mellitus ([place]) 08/31/2014   Type 2 diabetes mellitus ([place]) 08/24/2014    Allergies  Allergen Reactions   Canagliflozin Rash    Past Surgical History:  Procedure Laterality Date   ABLATION     CHOLECYSTECTOMY     COLONOSCOPY WITH PROPOFOL N/A 05/10/2017   Procedure: COLONOSCOPY WITH PROPOFOL;  Surgeon: Lucilla Lame, MD;  Location: [facility];  Service: Endoscopy;  Laterality: N/A;   ESOPHAGOGASTRODUODENOSCOPY (EGD) WITH PROPOFOL N/A 01/27/2021   Procedure: ESOPHAGOGASTRODUODENOSCOPY (EGD) WITH PROPOFOL;  Surgeon: Lucilla Lame, MD;  Location: [facility];  Service: Endoscopy;  Laterality: N/A;  Diabetic - oral meds   HERNIA REPAIR     PILONIDAL CYST EXCISION      Social History   Tobacco Use   Smoking status: Former    Types: Cigarettes    Quit date: 01/25/2009    Years since quitting: 12.7   Smokeless tobacco: Never  Vaping Use   Vaping Use: Never used  Substance Use Topics   Alcohol use: Yes    Alcohol/week: 0.0 standard drinks    Comment: occasionally   Drug use: No     Medication list has been reviewed and updated.  Current Meds  Medication Sig   aspirin EC 81 MG tablet Take 1 tablet by mouth daily.   BD PEN NEEDLE NANO U/F 32G X 4 MM MISC    busPIRone (BUSPAR) 5 MG tablet Take 1 tablet (5 mg total) by mouth 2 (two) times daily.   Dulaglutide 3 MG/0.5ML SOPN Inject into the skin.    empagliflozin (JARDIANCE) 10 MG TABS tablet Take 1 tablet by mouth daily.   glimepiride (AMARYL) 2 MG tablet Take 1 tablet by mouth daily. Holly Foley   hydrochlorothiazide (HYDRODIURIL) 12.5 MG  tablet Take 1 tablet (12.5 mg total) by mouth daily.   losartan (COZAAR) 50 MG tablet Take 1 tablet (50 mg total) by mouth daily.   metFORMIN (GLUMETZA) 500 MG (MOD) 24 hr tablet Take 1,000 mg by mouth 2 (two) times daily with a meal.   sertraline (ZOLOFT) 50 MG tablet Take 1 tablet (50 mg total) by mouth daily. Take 1 and a half tablet once a day (Patient taking differently: Take 50 mg by mouth daily. Take 1 tablet daily)   vitamin B-12 (CYANOCOBALAMIN) 1000 MCG tablet Take 1,000 mcg by mouth daily.    PHQ 2/9 Scores 10/31/2021 05/16/2021 01/11/2021 10/05/2020  PHQ - 2 Score 0 0 0 0  PHQ- 9 Score 0 0 0 8    GAD 7 : Generalized Anxiety Score 10/31/2021 05/16/2021 01/11/2021 10/05/2020  Nervous, Anxious, on Edge 0 0 0 0  Control/stop worrying 0 0 0 1  Worry too much - different things 0 0 0 0  Trouble relaxing 0 0 0 0  Restless 0 0 0 0  Easily annoyed or irritable 0 0 0 2  Afraid -  awful might happen 0 0 0 1  Total GAD 7 Score 0 0 0 4  Anxiety Difficulty - - - Not difficult at all    BP Readings from Last 3 Encounters:  10/31/21 120/68  05/16/21 120/80  02/27/21 119/78    Physical Exam Vitals and nursing note reviewed.  Constitutional:      General: She is not irritable.She is not in acute distress.    Appearance: She is not diaphoretic.  HENT:     Head: Normocephalic and atraumatic.     Right Ear: Tympanic membrane, ear canal and external ear normal. There is no impacted cerumen.     Left Ear: Tympanic membrane, ear canal and external ear normal. There is no impacted cerumen.     Nose: Nose normal. No congestion or rhinorrhea.     Mouth/Throat:     Pharynx: No oropharyngeal exudate or posterior oropharyngeal erythema.  Eyes:     General:        Right eye: No discharge.        Left eye: No discharge.     Conjunctiva/sclera: Conjunctivae normal.     Pupils: Pupils are equal, round, and reactive to light.  Neck:     Thyroid: No thyromegaly.     Vascular: No JVD.   Cardiovascular:     Rate and Rhythm: Normal rate and regular rhythm.     Heart sounds: Normal heart sounds. No murmur heard.   No friction rub. No gallop.  Pulmonary:     Effort: Pulmonary effort is normal.     Breath sounds: Normal breath sounds. No wheezing, rhonchi or rales.  Chest:     Chest wall: No tenderness.  Abdominal:     General: Bowel sounds are normal.     Palpations: Abdomen is soft. There is no mass.     Tenderness: There is no abdominal tenderness. There is no guarding or rebound.  Musculoskeletal:        General: Normal range of motion.     Cervical back: Normal range of motion and neck supple.  Lymphadenopathy:     Cervical: No cervical adenopathy.  Skin:    General: Skin is warm and dry.  Neurological:     Mental Status: She is alert.     Motor: No weakness.     Deep Tendon Reflexes: Reflexes are normal and symmetric.    Wt Readings from Last 3 Encounters:  10/31/21 180 lb (81.6 kg)  05/16/21 175 lb (79.4 kg)  02/27/21 175 lb (79.4 kg)    BP 120/68   Pulse 72   Ht 5' (1.524 m)   Wt 180 lb (81.6 kg)   BMI 35.15 kg/m   Assessment and Plan: 1. Essential hypertension Chronic.  Controlled.  Stable.  Blood pressure today is 120/68.  Continue losartan 50 mg once a day and hydrochlorothiazide 12.5 mg once a day. - hydrochlorothiazide (HYDRODIURIL) 12.5 MG tablet; Take 1 tablet (12.5 mg total) by mouth daily.  Dispense: 90 tablet; Refill: 1 - losartan (COZAAR) 50 MG tablet; Take 1 tablet (50 mg total) by mouth daily.  Dispense: 90 tablet; Refill: 1  2. Anxiety Chronic.  Controlled.  Stable.  Gad score is 0.  Continue buspirone 5 mg 1 twice a day. - busPIRone (BUSPAR) 5 MG tablet; Take 1 tablet (5 mg total) by mouth 2 (two) times daily.  Dispense: 180 tablet; Refill: 1  3. Reactive depression Chronic.  Controlled.  Stable.  PHQ is 0.  Continue sertraline 50 mg  once a day.  We will recheck in 6 months. - sertraline (ZOLOFT) 50 MG tablet; Take 1 tablet (50  mg total) by mouth daily. Take 1 tablet daily  Dispense: 90 tablet; Refill: 1

## 2022-01-26 LAB — HM DIABETES FOOT EXAM: HM Diabetic Foot Exam: NORMAL

## 2022-02-23 ENCOUNTER — Ambulatory Visit: Payer: Self-pay | Admitting: *Deleted

## 2022-02-23 ENCOUNTER — Other Ambulatory Visit: Payer: Self-pay

## 2022-02-23 ENCOUNTER — Emergency Department
Admission: EM | Admit: 2022-02-23 | Discharge: 2022-02-23 | Disposition: A | Discharge status: Home / Self Care | Payer: Managed Care, Other (non HMO) | Attending: Emergency Medicine | Admitting: Emergency Medicine

## 2022-02-23 ENCOUNTER — Ambulatory Visit: Payer: Managed Care, Other (non HMO) | Admitting: Family Medicine

## 2022-02-23 ENCOUNTER — Telehealth: Payer: Self-pay

## 2022-02-23 DIAGNOSIS — E119 Type 2 diabetes mellitus without complications: Secondary | ICD-10-CM | POA: Insufficient documentation

## 2022-02-23 DIAGNOSIS — I10 Essential (primary) hypertension: Secondary | ICD-10-CM | POA: Insufficient documentation

## 2022-02-23 DIAGNOSIS — E876 Hypokalemia: Secondary | ICD-10-CM | POA: Diagnosis not present

## 2022-02-23 DIAGNOSIS — R109 Unspecified abdominal pain: Secondary | ICD-10-CM | POA: Insufficient documentation

## 2022-02-23 DIAGNOSIS — R112 Nausea with vomiting, unspecified: Secondary | ICD-10-CM

## 2022-02-23 DIAGNOSIS — Z20822 Contact with and (suspected) exposure to covid-19: Secondary | ICD-10-CM | POA: Diagnosis not present

## 2022-02-23 LAB — COMPREHENSIVE METABOLIC PANEL
ALT: 19 U/L (ref 0–44)
AST: 25 U/L (ref 15–41)
Albumin: 4.2 g/dL (ref 3.5–5.0)
Alkaline Phosphatase: 71 U/L (ref 38–126)
Anion gap: 21 — ABNORMAL HIGH (ref 5–15)
BUN: 36 mg/dL — ABNORMAL HIGH (ref 6–20)
CO2: 21 mmol/L — ABNORMAL LOW (ref 22–32)
Calcium: 9.3 mg/dL (ref 8.9–10.3)
Chloride: 88 mmol/L — ABNORMAL LOW (ref 98–111)
Creatinine, Ser: 1.16 mg/dL — ABNORMAL HIGH (ref 0.44–1.00)
GFR, Estimated: 56 mL/min — ABNORMAL LOW (ref >60)
Glucose, Bld: 265 mg/dL — ABNORMAL HIGH (ref 70–99)
Potassium: 3.3 mmol/L — ABNORMAL LOW (ref 3.5–5.1)
Sodium: 130 mmol/L — ABNORMAL LOW (ref 135–145)
Total Bilirubin: 1.3 mg/dL — ABNORMAL HIGH (ref 0.3–1.2)
Total Protein: 8.4 g/dL — ABNORMAL HIGH (ref 6.5–8.1)

## 2022-02-23 LAB — URINALYSIS, COMPLETE (UACMP) WITH MICROSCOPIC
Bacteria, UA: NONE SEEN
Bilirubin Urine: NEGATIVE
Glucose, UA: >=500 mg/dL — AB
Ketones, ur: 80 mg/dL — AB
Leukocytes,Ua: NEGATIVE
Nitrite: NEGATIVE
Protein, ur: NEGATIVE mg/dL
Specific Gravity, Urine: 1.027 (ref 1.005–1.030)
pH: 5 (ref 5.0–8.0)

## 2022-02-23 LAB — CBC
HCT: 50.6 % — ABNORMAL HIGH (ref 36.0–46.0)
Hemoglobin: 17.6 g/dL — ABNORMAL HIGH (ref 12.0–15.0)
MCH: 29.1 pg (ref 26.0–34.0)
MCHC: 34.8 g/dL (ref 30.0–36.0)
MCV: 83.6 fL (ref 80.0–100.0)
Platelets: 317 10*3/uL (ref 150–400)
RBC: 6.05 MIL/uL — ABNORMAL HIGH (ref 3.87–5.11)
RDW: 12.7 % (ref 11.5–15.5)
WBC: 14.7 10*3/uL — ABNORMAL HIGH (ref 4.0–10.5)
nRBC: 0 % (ref 0.0–0.2)

## 2022-02-23 LAB — RESP PANEL BY RT-PCR (FLU A&B, COVID) ARPGX2
Influenza A by PCR: NEGATIVE
Influenza B by PCR: NEGATIVE
SARS Coronavirus 2 by RT PCR: NEGATIVE

## 2022-02-23 LAB — BASIC METABOLIC PANEL
Anion gap: 14 (ref 5–15)
BUN: 28 mg/dL — ABNORMAL HIGH (ref 6–20)
CO2: 24 mmol/L (ref 22–32)
Calcium: 7.9 mg/dL — ABNORMAL LOW (ref 8.9–10.3)
Chloride: 93 mmol/L — ABNORMAL LOW (ref 98–111)
Creatinine, Ser: 0.79 mg/dL (ref 0.44–1.00)
GFR, Estimated: >60 mL/min (ref >60)
Glucose, Bld: 164 mg/dL — ABNORMAL HIGH (ref 70–99)
Potassium: 3 mmol/L — ABNORMAL LOW (ref 3.5–5.1)
Sodium: 131 mmol/L — ABNORMAL LOW (ref 135–145)

## 2022-02-23 LAB — BLOOD GAS, VENOUS
Acid-Base Excess: 0.9 mmol/L (ref 0.0–2.0)
Bicarbonate: 23.8 mmol/L (ref 20.0–28.0)
O2 Saturation: 93.1 %
Patient temperature: 37
pCO2, Ven: 32 mmHg — ABNORMAL LOW (ref 44–60)
pH, Ven: 7.48 — ABNORMAL HIGH (ref 7.25–7.43)
pO2, Ven: 61 mmHg — ABNORMAL HIGH (ref 32–45)

## 2022-02-23 MED ORDER — ACETAMINOPHEN 500 MG PO TABS
1000.0000 mg | ORAL_TABLET | Freq: Once | ORAL | Status: DC
  Filled 2022-02-23: qty 2

## 2022-02-23 MED ORDER — POTASSIUM CHLORIDE CRYS ER 20 MEQ PO TBCR
40.0000 meq | EXTENDED_RELEASE_TABLET | Freq: Once | ORAL | Status: AC
  Administered 2022-02-23: 40 meq via ORAL
  Filled 2022-02-23: qty 2

## 2022-02-23 MED ORDER — ONDANSETRON HCL 4 MG/2ML IJ SOLN
4.0000 mg | Freq: Once | INTRAMUSCULAR | Status: AC
  Administered 2022-02-23: 4 mg via INTRAVENOUS
  Filled 2022-02-23: qty 2

## 2022-02-23 MED ORDER — PROMETHAZINE HCL 25 MG RE SUPP
25.0000 mg | Freq: Four times a day (QID) | RECTAL | 0 refills | Status: AC | PRN
Start: 2022-02-23 — End: ?

## 2022-02-23 MED ORDER — POTASSIUM CHLORIDE CRYS ER 20 MEQ PO TBCR: 20.0000 meq | EXTENDED_RELEASE_TABLET | Freq: Every day | ORAL | 0 refills | Status: DC

## 2022-02-23 MED ORDER — GLIMEPIRIDE 4 MG PO TABS: 4.0000 mg | ORAL_TABLET | Freq: Every day | ORAL | 36 refills | Status: DC

## 2022-02-23 MED ORDER — SODIUM CHLORIDE 0.9 % IV BOLUS
1000.0000 mL | Freq: Once | INTRAVENOUS | Status: AC
  Administered 2022-02-23: 1000 mL via INTRAVENOUS

## 2022-02-23 MED ORDER — POTASSIUM CHLORIDE 10 MEQ/100ML IV SOLN
10.0000 meq | INTRAVENOUS | Status: AC
  Administered 2022-02-23 (×2): 10 meq via INTRAVENOUS
  Filled 2022-02-23 (×2): qty 100

## 2022-02-23 NOTE — ED Notes (Signed)
Pt given a cup of water and graham crackers.  ?

## 2022-02-23 NOTE — Telephone Encounter (Signed)
Noted. Pt will go to ED.  KP 

## 2022-02-23 NOTE — ED Notes (Signed)
Pt verbalized understanding of discharge instructions, medications, and follow-up care instructions. Pt advised if symptoms worsen to return to ED. E-signature not available due to e-signature pad not working.  ?

## 2022-02-23 NOTE — ED Provider Notes (Signed)
? ?[facility] ?Provider Note ? ? ? Event Date/Time  ? First MD Initiated Contact with Patient 02/23/22 1504   ?  (approximate) ? ? ?History  ? ?Vomiting ? ? ?HPI ? ?Holly Foley is a 56 y.o. female  who, per endocrinology office note dated 01/25/22 has history of DM, gastroparesis, HTN, HLD, who presents to the emergency department today because of concern for nausea and vomiting. The patient states that her symptoms have been present for the past three days. She is having a hard time even keeping fluids down. Has history of gastroparesis and this does somewhat remind her of previous issues with that diagnosis. Additionally the patient thinks that the vomiting might have been triggered by her going back on trulicity the day before. The patient denies any abdominal pain or fevers.   ? ? ?Physical Exam  ? ?Triage Vital Signs: ?ED Triage Vitals [02/23/22 1038]  ?Enc Vitals Group  ?   BP (!) 138/91  ?   Pulse Rate (!) 115  ?   Resp 20  ?   Temp 97.8 ?F (36.6 ?C)  ?   Temp Source Oral  ?   SpO2 98 %  ?   Weight 175 lb (79.4 kg)  ?   Height 5' (1.524 m)  ?   Head Circumference   ?   Peak Flow   ?   Pain Score 0  ?   ?   ?   ? ? ?Most recent vital signs: ?Vitals:  ? 02/23/22 1038 02/23/22 1343  ?BP: (!) 138/91 130/90  ?Pulse: (!) 115 95  ?Resp: 20 16  ?Temp: 97.8 ?F (36.6 ?C) 98.6 ?F (37 ?C)  ?SpO2: 98% 97%  ? ? ?General: Awake, no distress.  ?CV:  Good peripheral perfusion. Regular rate and rhythm. ?Resp:  Normal effort. Lungs clear to auscultation. ?Abd:  No distention. Mild tenderness in the right side of abdomen.  ?   ? ? ?ED Results / Procedures / Treatments  ? ?Labs ?(all labs ordered are listed, but only abnormal results are displayed) ?Labs Reviewed  ?CBC - Abnormal; Notable for the following components:  ?    Result Value  ? WBC 14.7 (*)   ? RBC 6.05 (*)   ? Hemoglobin 17.6 (*)   ? HCT 50.6 (*)   ? All other components within normal limits  ?COMPREHENSIVE METABOLIC PANEL - Abnormal; Notable  for the following components:  ? Sodium 130 (*)   ? Potassium 3.3 (*)   ? Chloride 88 (*)   ? CO2 21 (*)   ? Glucose, Bld 265 (*)   ? BUN 36 (*)   ? Creatinine, Ser 1.16 (*)   ? Total Protein 8.4 (*)   ? Total Bilirubin 1.3 (*)   ? GFR, Estimated 56 (*)   ? Anion gap 21 (*)   ? All other components within normal limits  ?URINALYSIS, COMPLETE (UACMP) WITH MICROSCOPIC - Abnormal; Notable for the following components:  ? Color, Urine YELLOW (*)   ? APPearance HAZY (*)   ? Glucose, UA >=500 (*)   ? Hgb urine dipstick SMALL (*)   ? Ketones, ur 80 (*)   ? All other components within normal limits  ?BLOOD GAS, VENOUS - Abnormal; Notable for the following components:  ? pH, Ven 7.48 (*)   ? pCO2, Ven 32 (*)   ? pO2, Ven 61 (*)   ? All other components within normal limits  ?BASIC METABOLIC PANEL -  Abnormal; Notable for the following components:  ? Sodium 131 (*)   ? Potassium 3.0 (*)   ? Chloride 93 (*)   ? Glucose, Bld 164 (*)   ? BUN 28 (*)   ? Calcium 7.9 (*)   ? All other components within normal limits  ?RESP PANEL BY RT-PCR (FLU A&B, COVID) [id number]  ? ? ? ?EKG ? ?None ? ? ?RADIOLOGY ?None ? ? ?PROCEDURES: ? ?Critical Care performed: No ? ?Procedures ? ? ?MEDICATIONS ORDERED IN ED: ?Medications - No data to display ? ? ?IMPRESSION / MDM / ASSESSMENT AND PLAN / ED COURSE  ?I reviewed the triage vital signs and the nursing notes. ?             ?               ? ?Differential diagnosis includes, but is not limited to, gastroenteritis, DKA, food poisoning, appendicitis.  ? ?Patient presented to the emergency department today because of concerns for nausea and vomiting.  States she did have some tenderness in her right abdomen.  Blood work here was initially concerning for elevated blood sugar, elevated anion gap and she did have ketones in her urine.  VBG however without any acidosis.  She was given IV fluids and medication and did feel improvement.  She was able to tolerate p.o.  I did repeat a BMP which showed a normal  anion gap.  Interestingly did show slight decrease in her potassium.  I do think potassium is likely low secondary to poor oral intake.  Will plan on giving patient prescription for potassium supplementation over the next couple of days.  I did have a discussion with the patient about possibility of appendicitis given mild leukocytosis and right-sided tenderness and did discuss potentially getting a CT.  However given that this reminded her of previous gastroparesis she did not feel CT is necessary at this time and I think it is reasonable to defer. ?  ? ? ?FINAL CLINICAL IMPRESSION(S) / ED DIAGNOSES  ? ?Final diagnoses:  ?Nausea and vomiting, unspecified vomiting type  ?Hypokalemia  ? ? ? ?Rx / DC Orders  ? ?ED Discharge Orders   ? ?      Ordered  ?  potassium chloride SA (KLOR-CON M) 20 MEQ tablet  Daily       ? 02/23/22 2014  ?  promethazine (PHENERGAN) 25 MG suppository  Every 6 hours PRN       ? 02/23/22 2014  ? ?  ?  ? ?  ? ? ? ?Note:  This document was prepared using Dragon voice recognition software and may include unintentional dictation errors. ? ?  ?Phineas Semen, MD ?02/23/22 2023 ? ?

## 2022-02-23 NOTE — ED Notes (Signed)
Pt present to the ED with c/o of nausea, vomiting, and diarrhea that has been going on since Monday. Pt states she has not been able to keep anything down since Monday. Pt states she feels a little nauseous but the vomiting has gotten better since she been in the room. Pt is A&Ox4.  ? ?Pt denies any SOB and is NAD at this time. Denies any pain at the moment.  ?

## 2022-02-23 NOTE — Telephone Encounter (Signed)
?  Chief Complaint: vomiting and can not eat or drink.  ?Symptoms: vomiting after eating or drinking , can tolerate fluids x 30 minutes but vomits at times. Patient started back on Trulicity Monday night after drug being on back order. Monday night started vomiting and progressively worsened. Called endocrinologist and medication changed. C/o weakness , unable to walk without holding on to something and dizziness. Blood glucose this am 230.  ?Frequency: since Monday 02/19/22 ?Pertinent Negatives: Patient denies na  ?Disposition: [x] ED /[] Urgent Care (no appt availability in office) / [] Appointment(In office/virtual)/ []  [facility]/ [] Home Care/ [] Refused Recommended Disposition /[]  Mobile Bus/ []  Follow-up with PCP ?Additional Notes:  ? ?Recommended patient be evaluated in ED due to sx and now worsening. Requesting appt today and recommended ED. Please advise if ok for patient to come to office. [facility] notified sent patient to ED.  ? ? Reason for Disposition ? [1] Drinking very little AND [2] dehydration suspected (e.g., no urine > 12 hours, very dry mouth, very lightheaded) ? ?Answer Assessment - Initial Assessment Questions ?1. VOMITING SEVERITY: "How many times have you vomited in the past 24 hours?"  ?   - MILD:  1 - 2 times/day ?   - MODERATE: 3 - 5 times/day, decreased oral intake without significant weight loss or symptoms of dehydration ?   - SEVERE: 6 or more times/day, vomits everything or nearly everything, with significant weight loss, symptoms of dehydration  ?    Vomiting every time she eats  ?2. ONSET: "When did the vomiting begin?"  ?    Monday night after taking Trulicity ?3. FLUIDS: "What fluids or food have you vomited up today?" "Have you been able to keep any fluids down?" ?    Na  ?4. ABDOMINAL PAIN: "Are your having any abdominal pain?" If yes : "How bad is it and what does it feel like?" (e.g., crampy, dull, intermittent, constant)  ?    na ?5. DIARRHEA: "Is there any  diarrhea?" If Yes, ask: "How many times today?"  ?    na ?6. CONTACTS: "Is there anyone else in the family with the same symptoms?"  ?    na ?7. CAUSE: "What do you think is causing your vomiting?" ?    Not sure hx gastroparesis ?8. HYDRATION STATUS: "Any signs of dehydration?" (e.g., dry mouth [not only dry lips], too weak to stand) "When did you last urinate?" ?    Weak standing and dizzy have to hold something to walk ?9. OTHER SYMPTOMS: "Do you have any other symptoms?" (e.g., fever, headache, vertigo, vomiting blood or coffee grounds, recent head injury) ?    Weak to stand holds something to walk, vomits after eating or drinking since Monday night  ?10. PREGNANCY: "Is there any chance you are pregnant?" "When was your last menstrual period?" ?      na ? ?Protocols used: Vomiting-A-AH ? ?

## 2022-02-23 NOTE — Telephone Encounter (Signed)
Spoke to pt- she had a dose of trulicity from endo and started with diarrhea. Pt called endo and they did advise her to stop trulicity and increased her Amaryl to 4mg  qday. Pt stated she can't even keep water down. I explained she should go to hospital where they can give her IV fluids and monitor her blood sugars while getting through this. I also told her there is a stomach bug going around and this may not have anything to do with the trulicity at all. Pt agreed to got to ER for further eval ?

## 2022-02-23 NOTE — ED Notes (Signed)
Pt tolerated graham crackers and water well. No complaints of nausea nor vomiting.  ?

## 2022-02-23 NOTE — Discharge Instructions (Signed)
Please seek medical attention for any high fevers, chest pain, shortness of breath, change in behavior, persistent vomiting, bloody stool or any other new or concerning symptoms.  

## 2022-02-23 NOTE — ED Triage Notes (Addendum)
Pt here for vomiting since Monday and some diarrhea. Pt states has had high blood sugar since. States pmd has been adjusting diabetic meds. Pt denies abd pain.  ?

## 2022-02-26 ENCOUNTER — Encounter: Payer: Self-pay | Admitting: Family Medicine

## 2022-02-28 ENCOUNTER — Ambulatory Visit: Payer: Managed Care, Other (non HMO) | Admitting: Family Medicine

## 2022-03-01 ENCOUNTER — Ambulatory Visit (INDEPENDENT_AMBULATORY_CARE_PROVIDER_SITE_OTHER): Payer: Managed Care, Other (non HMO) | Admitting: Family Medicine

## 2022-03-01 ENCOUNTER — Other Ambulatory Visit: Payer: Self-pay

## 2022-03-01 ENCOUNTER — Encounter: Payer: Self-pay | Admitting: Family Medicine

## 2022-03-01 VITALS — BP 110/80 | HR 76 | Ht 60.0 in | Wt 175.0 lb

## 2022-03-01 DIAGNOSIS — D72829 Elevated white blood cell count, unspecified: Secondary | ICD-10-CM

## 2022-03-01 DIAGNOSIS — R112 Nausea with vomiting, unspecified: Secondary | ICD-10-CM

## 2022-03-01 DIAGNOSIS — E876 Hypokalemia: Secondary | ICD-10-CM | POA: Diagnosis not present

## 2022-03-01 DIAGNOSIS — R7989 Other specified abnormal findings of blood chemistry: Secondary | ICD-10-CM

## 2022-03-01 DIAGNOSIS — E119 Type 2 diabetes mellitus without complications: Secondary | ICD-10-CM

## 2022-03-01 NOTE — Progress Notes (Signed)
? ? ?Date:  03/01/2022  ? ?Name:  Holly Foley   DOB:  03-Jul-1966   MRN:  [id number] ? ? ?Chief Complaint: Follow-up ? ?Patient is a 56 year old female who presents for a emergency room followup exam. The patient reports the following problems: none. Health maintenance has been reviewed up to date. ?  ? ? ?Lab Results  ?Component Value Date  ? NA 131 (L) 02/23/2022  ? K 3.0 (L) 02/23/2022  ? CO2 24 02/23/2022  ? GLUCOSE 164 (H) 02/23/2022  ? BUN 28 (H) 02/23/2022  ? CREATININE 0.79 02/23/2022  ? CALCIUM 7.9 (L) 02/23/2022  ? GFRNONAA >60 02/23/2022  ? ?Lab Results  ?Component Value Date  ? CHOL 147 04/06/2021  ? HDL 47 04/06/2021  ? LDLCALC 82 04/06/2021  ? TRIG 87 04/06/2021  ? CHOLHDL 2.9 04/15/2017  ? ?Lab Results  ?Component Value Date  ? TSH 1.450 10/05/2020  ? ?Lab Results  ?Component Value Date  ? HGBA1C 7.3 10/31/2021  ? ?Lab Results  ?Component Value Date  ? WBC 14.7 (H) 02/23/2022  ? HGB 17.6 (H) 02/23/2022  ? HCT 50.6 (H) 02/23/2022  ? MCV 83.6 02/23/2022  ? PLT 317 02/23/2022  ? ?Lab Results  ?Component Value Date  ? ALT 19 02/23/2022  ? AST 25 02/23/2022  ? ALKPHOS 71 02/23/2022  ? BILITOT 1.3 (H) 02/23/2022  ? ?No results found for: 25OHVITD2, 25OHVITD3, VD25OH  ? ?Review of Systems  ?Constitutional:  Negative for chills and fever.  ?HENT:  Negative for drooling, ear discharge, ear pain and sore throat.   ?Respiratory:  Negative for cough, shortness of breath and wheezing.   ?Cardiovascular:  Negative for chest pain, palpitations and leg swelling.  ?Gastrointestinal:  Negative for abdominal pain, blood in stool, constipation, diarrhea and nausea.  ?Endocrine: Negative for polydipsia.  ?Genitourinary:  Negative for dysuria, frequency, hematuria and urgency.  ?Musculoskeletal:  Negative for back pain, myalgias and neck pain.  ?Skin:  Negative for rash.  ?Allergic/Immunologic: Negative for environmental allergies.  ?Neurological:  Negative for dizziness and headaches.  ?Hematological:  Does not  bruise/bleed easily.  ?Psychiatric/Behavioral:  Negative for suicidal ideas. The patient is not nervous/anxious.   ? ?Patient Active Problem List  ? Diagnosis Date Noted  ? Intractable vomiting   ? Special screening for malignant neoplasms, colon   ? Recurrent major depressive disorder, in partial remission (HCC) 04/15/2017  ? Reactive depression 04/15/2017  ? Colon cancer screening 04/15/2017  ? Class 2 obesity with serious comorbidity and body mass index (BMI) of 39.0 to 39.9 in adult 04/15/2017  ? Recurrent ventral incisional hernia 07/03/2016  ? Periumbilical abdominal pain 06/25/2016  ? HLD (hyperlipidemia) 01/31/2016  ? Essential hypertension 01/31/2016  ? Near syncope 01/31/2016  ? Fast heart beat 01/31/2016  ? B12 deficiency 08/31/2014  ? Diabetes mellitus (HCC) 08/31/2014  ? Type 2 diabetes mellitus (HCC) 08/24/2014  ? ? ?Allergies  ?Allergen Reactions  ? Canagliflozin Rash  ? ? ?Past Surgical History:  ?Procedure Laterality Date  ? ABLATION    ? CHOLECYSTECTOMY    ? COLONOSCOPY WITH PROPOFOL N/A 05/10/2017  ? Procedure: COLONOSCOPY WITH PROPOFOL;  Surgeon: Midge MiniumWohl, Darren, MD;  [facility];  Service: Endoscopy;  Laterality: N/A;  ? ESOPHAGOGASTRODUODENOSCOPY (EGD) WITH PROPOFOL N/A 01/27/2021  ? Procedure: ESOPHAGOGASTRODUODENOSCOPY (EGD) WITH PROPOFOL;  Surgeon: Midge MiniumWohl, Darren, MD;  [facility];  Service: Endoscopy;  Laterality: N/A;  Diabetic - oral meds  ? HERNIA REPAIR    ?  PILONIDAL CYST EXCISION    ? ? ?Social History  ? ?Tobacco Use  ? Smoking status: Former  ?  Types: Cigarettes  ?  Quit date: 01/25/2009  ?  Years since quitting: 13.1  ? Smokeless tobacco: Never  ?Vaping Use  ? Vaping Use: Never used  ?Substance Use Topics  ? Alcohol use: Yes  ?  Alcohol/week: 0.0 standard drinks  ?  Comment: occasionally  ? Drug use: No  ? ? ? ?Medication list has been reviewed and updated. ? ?Current Meds  ?Medication Sig  ? aspirin EC 81 MG tablet Take 1 tablet by mouth daily.  ? BD PEN  NEEDLE NANO U/F 32G X 4 MM MISC   ? busPIRone (BUSPAR) 5 MG tablet Take 1 tablet (5 mg total) by mouth 2 (two) times daily.  ? empagliflozin (JARDIANCE) 10 MG TABS tablet Take 1 tablet by mouth daily.  ? glimepiride (AMARYL) 4 MG tablet Take 1 tablet (4 mg total) by mouth daily. Holly Foley  ? hydrochlorothiazide (HYDRODIURIL) 12.5 MG tablet Take 1 tablet (12.5 mg total) by mouth daily.  ? losartan (COZAAR) 50 MG tablet Take 1 tablet (50 mg total) by mouth daily.  ? metFORMIN (GLUMETZA) 500 MG (MOD) 24 hr tablet Take 1,000 mg by mouth 2 (two) times daily with a meal.  ? sertraline (ZOLOFT) 50 MG tablet Take 1 tablet (50 mg total) by mouth daily. Take 1 tablet daily  ? vitamin B-12 (CYANOCOBALAMIN) 1000 MCG tablet Take 1,000 mcg by mouth daily.  ? ? ?PHQ 2/9 Scores 03/01/2022 10/31/2021 05/16/2021 01/11/2021  ?PHQ - 2 Score 0 0 0 0  ?PHQ- 9 Score 0 0 0 0  ? ? ?GAD 7 : Generalized Anxiety Score 03/01/2022 10/31/2021 05/16/2021 01/11/2021  ?Nervous, Anxious, on Edge 0 0 0 0  ?Control/stop worrying 0 0 0 0  ?Worry too much - different things 0 0 0 0  ?Trouble relaxing 0 0 0 0  ?Restless 0 0 0 0  ?Easily annoyed or irritable 0 0 0 0  ?Afraid - awful might happen 0 0 0 0  ?Total GAD 7 Score 0 0 0 0  ?Anxiety Difficulty Not difficult at all - - -  ? ? ?BP Readings from Last 3 Encounters:  ?03/01/22 110/80  ?02/23/22 122/71  ?10/31/21 120/68  ? ? ?Physical Exam ?Vitals and nursing note reviewed. Exam conducted with a chaperone present.  ?Constitutional:   ?   General: She is not in acute distress. ?   Appearance: She is not diaphoretic.  ?HENT:  ?   Head: Normocephalic and atraumatic.  ?   Right Ear: Tympanic membrane, ear canal and external ear normal.  ?   Left Ear: Tympanic membrane, ear canal and external ear normal.  ?   Nose: Nose normal. No congestion or rhinorrhea.  ?Eyes:  ?   General:     ?   Right eye: No discharge.     ?   Left eye: No discharge.  ?   Conjunctiva/sclera: Conjunctivae normal.  ?   Pupils: Pupils are  equal, round, and reactive to light.  ?Neck:  ?   Thyroid: No thyromegaly.  ?   Vascular: No JVD.  ?Cardiovascular:  ?   Rate and Rhythm: Normal rate and regular rhythm.  ?   Heart sounds: Normal heart sounds. No murmur heard. ?  No friction rub. No gallop.  ?Pulmonary:  ?   Effort: Pulmonary effort is normal.  ?   Breath sounds: Normal  breath sounds. No wheezing or rhonchi.  ?Abdominal:  ?   General: Bowel sounds are normal.  ?   Palpations: Abdomen is soft. There is no mass.  ?   Tenderness: There is no abdominal tenderness. There is no guarding.  ?Musculoskeletal:     ?   General: Normal range of motion.  ?   Cervical back: Normal range of motion and neck supple.  ?Lymphadenopathy:  ?   Cervical: No cervical adenopathy.  ?Skin: ?   General: Skin is warm and dry.  ?   Capillary Refill: Capillary refill takes less than 2 seconds.  ?Neurological:  ?   Mental Status: She is alert.  ?   Deep Tendon Reflexes: Reflexes are normal and symmetric.  ? ? ?Wt Readings from Last 3 Encounters:  ?03/01/22 175 lb (79.4 kg)  ?02/23/22 175 lb (79.4 kg)  ?10/31/21 180 lb (81.6 kg)  ? ? ?BP 110/80   Pulse 76   Ht 5' (1.524 m)   Wt 175 lb (79.4 kg)   BMI 34.18 kg/m?  ? ?Assessment and Plan: ? ?1. Nausea and vomiting, unspecified vomiting type ?New onset probably secondary to medication.  Patient is doing better at this time which is controlled and stable.  Patient will continue to hydrate and eat as tolerated. ? ?2. Hypokalemia ?Noted to have low potassium and we will check a renal function panel. ?- Renal Function Panel ? ?3. Hypocalcemia ?Noted to have a low calcium and we will recheck calcium level. ?- Renal Function Panel ? ?4. Abnormal CBC ?Noted recheck current glucose level and patient is to continue her current diabetic regimen. ? ?5. Leukocytosis, unspecified type ?See above ? ?6. Type 2 diabetes mellitus without complication, without long-term current use of insulin (HCC) ?We will check renal function panel to assess  current glucose status.  Patient is followed by endocrinology for her diabetes. ? ? ?

## 2022-03-02 LAB — RENAL FUNCTION PANEL
Albumin: 4.4 g/dL (ref 3.8–4.9)
BUN/Creatinine Ratio: 19 (ref 9–23)
BUN: 15 mg/dL (ref 6–24)
CO2: 24 mmol/L (ref 20–29)
Calcium: 10.6 mg/dL — ABNORMAL HIGH (ref 8.7–10.2)
Chloride: 93 mmol/L — ABNORMAL LOW (ref 96–106)
Creatinine, Ser: 0.78 mg/dL (ref 0.57–1.00)
Glucose: 190 mg/dL — ABNORMAL HIGH (ref 70–99)
Phosphorus: 3.5 mg/dL (ref 3.0–4.3)
Potassium: 4.3 mmol/L (ref 3.5–5.2)
Sodium: 135 mmol/L (ref 134–144)
eGFR: 90 mL/min/{1.73_m2} (ref >59)

## 2022-03-05 ENCOUNTER — Other Ambulatory Visit: Payer: Self-pay

## 2022-03-05 DIAGNOSIS — I10 Essential (primary) hypertension: Secondary | ICD-10-CM

## 2022-03-05 MED ORDER — LOSARTAN POTASSIUM 50 MG PO TABS: 50.0000 mg | ORAL_TABLET | Freq: Every day | ORAL | 0 refills | Status: DC

## 2022-04-16 ENCOUNTER — Encounter: Payer: Self-pay | Admitting: Family Medicine

## 2022-04-27 ENCOUNTER — Telehealth: Payer: Self-pay | Admitting: Gastroenterology

## 2022-04-27 NOTE — Telephone Encounter (Signed)
Pt would like a call back to discuss getting FMLA  papers filled out again she didn't realize that it had to be done every year. ?

## 2022-04-27 NOTE — Telephone Encounter (Signed)
Pt scheduled for 5/11 for f/u to have up to date notes to support FMLA ?

## 2022-05-01 ENCOUNTER — Ambulatory Visit (INDEPENDENT_AMBULATORY_CARE_PROVIDER_SITE_OTHER): Payer: Managed Care, Other (non HMO) | Admitting: Family Medicine

## 2022-05-01 ENCOUNTER — Encounter: Payer: Self-pay | Admitting: Family Medicine

## 2022-05-01 VITALS — BP 110/60 | HR 68 | Ht 60.0 in | Wt 177.0 lb

## 2022-05-01 DIAGNOSIS — F329 Major depressive disorder, single episode, unspecified: Secondary | ICD-10-CM

## 2022-05-01 DIAGNOSIS — E349 Endocrine disorder, unspecified: Secondary | ICD-10-CM

## 2022-05-01 DIAGNOSIS — F419 Anxiety disorder, unspecified: Secondary | ICD-10-CM | POA: Diagnosis not present

## 2022-05-01 DIAGNOSIS — I10 Essential (primary) hypertension: Secondary | ICD-10-CM

## 2022-05-01 DIAGNOSIS — F17210 Nicotine dependence, cigarettes, uncomplicated: Secondary | ICD-10-CM

## 2022-05-01 MED ORDER — LOSARTAN POTASSIUM 50 MG PO TABS: 50.0000 mg | ORAL_TABLET | Freq: Every day | ORAL | 1 refills | Status: DC

## 2022-05-01 MED ORDER — SERTRALINE HCL 50 MG PO TABS: 50.0000 mg | ORAL_TABLET | Freq: Every day | ORAL | 1 refills | Status: DC

## 2022-05-01 MED ORDER — HYDROCHLOROTHIAZIDE 12.5 MG PO TABS: 12.5000 mg | ORAL_TABLET | Freq: Every day | ORAL | 1 refills | Status: DC

## 2022-05-01 MED ORDER — BUSPIRONE HCL 5 MG PO TABS: 5.0000 mg | ORAL_TABLET | Freq: Two times a day (BID) | ORAL | 1 refills | Status: DC

## 2022-05-01 NOTE — Progress Notes (Signed)
Date:  05/01/2022   Name:  Holly Foley   DOB:  06-04-1966   MRN:  [id number]   Chief Complaint: Hypertension, Anxiety, and Depression  Hypertension This is a chronic problem. The current episode started more than 1 year ago. The problem has been gradually improving since onset. The problem is controlled. Associated symptoms include anxiety. Pertinent negatives include no chest pain, headaches, neck pain, orthopnea, palpitations, PND or shortness of breath. There are no associated agents to hypertension. Past treatments include angiotensin blockers and diuretics. The current treatment provides moderate improvement. There are no compliance problems.  There is no history of angina, kidney disease, CAD/MI, CVA, heart failure, left ventricular hypertrophy, PVD or retinopathy. There is no history of chronic renal disease, a hypertension causing med or renovascular disease.  Anxiety Presents for follow-up visit. Symptoms include nervous/anxious behavior. Patient reports no chest pain, dizziness, excessive worry, irritability, nausea, palpitations, panic, restlessness, shortness of breath or suicidal ideas.    Depression        This is a chronic problem.  The current episode started more than 1 year ago.   The problem occurs daily.  Associated symptoms include no helplessness, no hopelessness, no restlessness, no decreased interest, no myalgias, no headaches, not sad and no suicidal ideas.  Past treatments include other medications.  Past medical history includes anxiety.    Lab Results  Component Value Date   NA 135 03/01/2022   K 4.3 03/01/2022   CO2 24 03/01/2022   GLUCOSE 190 (H) 03/01/2022   BUN 15 03/01/2022   CREATININE 0.78 03/01/2022   CALCIUM 10.6 (H) 03/01/2022   EGFR 90 03/01/2022   GFRNONAA >60 02/23/2022   Lab Results  Component Value Date   CHOL 147 04/06/2021   HDL 47 04/06/2021   LDLCALC 82 04/06/2021   TRIG 87 04/06/2021   CHOLHDL 2.9 04/15/2017   Lab Results   Component Value Date   TSH 1.450 10/05/2020   Lab Results  Component Value Date   HGBA1C 7.3 10/31/2021   Lab Results  Component Value Date   WBC 14.7 (H) 02/23/2022   HGB 17.6 (H) 02/23/2022   HCT 50.6 (H) 02/23/2022   MCV 83.6 02/23/2022   PLT 317 02/23/2022   Lab Results  Component Value Date   ALT 19 02/23/2022   AST 25 02/23/2022   ALKPHOS 71 02/23/2022   BILITOT 1.3 (H) 02/23/2022   No results found for: 25OHVITD2, 25OHVITD3, VD25OH   Review of Systems  Constitutional:  Negative for chills, fever and irritability.  HENT:  Negative for drooling, ear discharge, ear pain and sore throat.   Respiratory:  Negative for cough, shortness of breath and wheezing.   Cardiovascular:  Negative for chest pain, palpitations, orthopnea, leg swelling and PND.  Gastrointestinal:  Negative for abdominal pain, blood in stool, constipation, diarrhea and nausea.  Endocrine: Negative for polydipsia.  Genitourinary:  Negative for dysuria, frequency, hematuria and urgency.  Musculoskeletal:  Negative for back pain, myalgias and neck pain.  Skin:  Negative for rash.  Allergic/Immunologic: Negative for environmental allergies.  Neurological:  Negative for dizziness and headaches.  Hematological:  Does not bruise/bleed easily.  Psychiatric/Behavioral:  Positive for depression. Negative for suicidal ideas. The patient is nervous/anxious.    Patient Active Problem List   Diagnosis Date Noted   Intractable vomiting    Special screening for malignant neoplasms, colon    Recurrent major depressive disorder, in partial remission (HCC) 04/15/2017   Reactive depression 04/15/2017  Colon cancer screening 04/15/2017   Class 2 obesity with serious comorbidity and body mass index (BMI) of 39.0 to 39.9 in adult 04/15/2017   Recurrent ventral incisional hernia 07/03/2016   Periumbilical abdominal pain 06/25/2016   HLD (hyperlipidemia) 01/31/2016   Essential hypertension 01/31/2016   Near syncope  01/31/2016   Fast heart beat 01/31/2016   B12 deficiency 08/31/2014   Diabetes mellitus (HCC) 08/31/2014   Type 2 diabetes mellitus (HCC) 08/24/2014    Allergies  Allergen Reactions   Canagliflozin Rash    Past Surgical History:  Procedure Laterality Date   ABLATION     CHOLECYSTECTOMY     COLONOSCOPY WITH PROPOFOL N/A 05/10/2017   Procedure: COLONOSCOPY WITH PROPOFOL;  Surgeon: Midge Minium, MD;  Location: [facility];  Service: Endoscopy;  Laterality: N/A;   ESOPHAGOGASTRODUODENOSCOPY (EGD) WITH PROPOFOL N/A 01/27/2021   Procedure: ESOPHAGOGASTRODUODENOSCOPY (EGD) WITH PROPOFOL;  Surgeon: Midge Minium, MD;  Location: [facility];  Service: Endoscopy;  Laterality: N/A;  Diabetic - oral meds   HERNIA REPAIR     PILONIDAL CYST EXCISION      Social History   Tobacco Use   Smoking status: Some Days    Types: Cigarettes   Smokeless tobacco: Never  Vaping Use   Vaping Use: Never used  Substance Use Topics   Alcohol use: Yes    Alcohol/week: 0.0 standard drinks    Comment: occasionally   Drug use: No     Medication list has been reviewed and updated.  Current Meds  Medication Sig   aspirin EC 81 MG tablet Take 1 tablet by mouth daily.   BD PEN NEEDLE NANO U/F 32G X 4 MM MISC    busPIRone (BUSPAR) 5 MG tablet Take 1 tablet (5 mg total) by mouth 2 (two) times daily.   empagliflozin (JARDIANCE) 10 MG TABS tablet Take 1 tablet by mouth daily.   glimepiride (AMARYL) 4 MG tablet Take 1 tablet (4 mg total) by mouth daily. Holly Foley   hydrochlorothiazide (HYDRODIURIL) 12.5 MG tablet Take 1 tablet (12.5 mg total) by mouth daily.   losartan (COZAAR) 50 MG tablet Take 1 tablet (50 mg total) by mouth daily.   metFORMIN (GLUMETZA) 500 MG (MOD) 24 hr tablet Take 1,000 mg by mouth 2 (two) times daily with a meal.   sertraline (ZOLOFT) 50 MG tablet Take 1 tablet (50 mg total) by mouth daily. Take 1 tablet daily   vitamin B-12 (CYANOCOBALAMIN) 1000 MCG tablet Take  1,000 mcg by mouth daily.       05/01/2022   10:04 AM 03/01/2022   10:13 AM 10/31/2021    8:05 AM 05/16/2021    8:01 AM  GAD 7 : Generalized Anxiety Score  Nervous, Anxious, on Edge 2 0 0 0  Control/stop worrying 0 0 0 0  Worry too much - different things 0 0 0 0  Trouble relaxing 0 0 0 0  Restless 0 0 0 0  Easily annoyed or irritable 1 0 0 0  Afraid - awful might happen 0 0 0 0  Total GAD 7 Score 3 0 0 0  Anxiety Difficulty Not difficult at all Not difficult at all         05/01/2022   10:03 AM  Depression screen PHQ 2/9  Decreased Interest 0  Down, Depressed, Hopeless 0  PHQ - 2 Score 0  Altered sleeping 0  Tired, decreased energy 0  Change in appetite 1  Feeling bad or failure about yourself  0  Trouble concentrating 0  Moving slowly or fidgety/restless 0  Suicidal thoughts 0  PHQ-9 Score 1  Difficult doing work/chores Not difficult at all    BP Readings from Last 3 Encounters:  05/01/22 110/60  03/01/22 110/80  02/23/22 122/71    Physical Exam Vitals and nursing note reviewed. Exam conducted with a chaperone present.  Constitutional:      General: She is not in acute distress.    Appearance: She is not diaphoretic.  HENT:     Head: Normocephalic and atraumatic.     Right Ear: Tympanic membrane and external ear normal.     Left Ear: Tympanic membrane and external ear normal.     Nose: Nose normal. No congestion or rhinorrhea.     Mouth/Throat:     Mouth: Mucous membranes are moist.  Eyes:     General:        Right eye: No discharge.        Left eye: No discharge.     Conjunctiva/sclera: Conjunctivae normal.     Pupils: Pupils are equal, round, and reactive to light.  Neck:     Thyroid: No thyromegaly.     Vascular: No JVD.  Cardiovascular:     Rate and Rhythm: Normal rate and regular rhythm.     Heart sounds: Normal heart sounds. No murmur heard.   No friction rub. No gallop.  Pulmonary:     Effort: Pulmonary effort is normal.     Breath sounds:  Normal breath sounds. No wheezing, rhonchi or rales.  Abdominal:     General: Bowel sounds are normal.     Palpations: Abdomen is soft. There is no mass.     Tenderness: There is no abdominal tenderness. There is no guarding.  Musculoskeletal:        General: Normal range of motion.     Cervical back: Normal range of motion and neck supple.  Lymphadenopathy:     Cervical: No cervical adenopathy.  Skin:    General: Skin is warm and dry.  Neurological:     Mental Status: She is alert.     Deep Tendon Reflexes: Reflexes are normal and symmetric.    Wt Readings from Last 3 Encounters:  05/01/22 177 lb (80.3 kg)  03/01/22 175 lb (79.4 kg)  02/23/22 175 lb (79.4 kg)    BP 110/60   Pulse 68   Ht 5' (1.524 m)   Wt 177 lb (80.3 kg)   BMI 34.57 kg/m   Assessment and Plan:  1. Essential hypertension Chronic.  Controlled.  Stable.  Blood pressure today 110/60.  Continue losartan 50 mg once a day.  We will also continue hydrochlorothiazide 12.5 mg once a day.  Review of most recent renal function panel is acceptable. - losartan (COZAAR) 50 MG tablet; Take 1 tablet (50 mg total) by mouth daily.  Dispense: 90 tablet; Refill: 1 - hydrochlorothiazide (HYDRODIURIL) 12.5 MG tablet; Take 1 tablet (12.5 mg total) by mouth daily.  Dispense: 90 tablet; Refill: 1  2. Anxiety Chronic.  Controlled.  Stable.  Continue buspirone 5 mg 1 twice a day.  Gad score is 3. - busPIRone (BUSPAR) 5 MG tablet; Take 1 tablet (5 mg total) by mouth 2 (two) times daily.  Dispense: 180 tablet; Refill: 1  3. Reactive depression Chronic.  Controlled.  Stable.  PHQ is 1.  Continue sertraline 50 mg once a day.  We will recheck in 6 months. - sertraline (ZOLOFT) 50 MG tablet; Take 1 tablet (50  mg total) by mouth daily. Take 1 tablet daily  Dispense: 90 tablet; Refill: 1  4. Cigarette nicotine dependence without complication Patient has been advised of the health risks of smoking and counseled concerning cessation of  tobacco products. I spent over 3 minutes for discussion and to answer questions.   5. Elevated calcitonin level Patient was noted to have an elevated calcium after a below normal calcium with him back to back readings.  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  I will recheck calcium to see if his return to normal range. - Calcium

## 2022-05-02 LAB — CALCIUM: Calcium: 9.3 mg/dL (ref 8.7–10.2)

## 2022-05-02 NOTE — Progress Notes (Signed)
? ? ?Primary Care Physician: Duanne Limerick, MD ? ?Primary Gastroenterologist:  Dr. Midge Minium ? ?Chief Complaint  ?Patient presents with  ? Follow-up  ? FMLA  ?  Needs PPW updated  ? ? ?HPI: Holly Foley is a 56 y.o. female here for follow-up after having upper endoscopy with retained food in the stomach.  The patient was sent for a gastric emptying study was unable to keep the food down long enough to have the study completed.  The patient had contacted her primary care provider for FMLA papers to be filled out and was directed to see me for this.  ?She is not taken the reglan for some time. She says she has been staying the same weight. ? ?Past Medical History:  ?Diagnosis Date  ? Class 2 obesity with serious comorbidity and body mass index (BMI) of 39.0 to 39.9 in adult 04/15/2017  ? Depression   ? HLD (hyperlipidemia) 01/31/2016  ? Hypertension   ? Recurrent major depressive disorder, in partial remission (HCC) 04/15/2017  ? Type 2 diabetes mellitus (HCC) 08/24/2014  ? ? ?Current Outpatient Medications  ?Medication Sig Dispense Refill  ? aspirin EC 81 MG tablet Take 1 tablet by mouth daily.    ? BD PEN NEEDLE NANO U/F 32G X 4 MM MISC   0  ? busPIRone (BUSPAR) 5 MG tablet Take 1 tablet (5 mg total) by mouth 2 (two) times daily. 180 tablet 1  ? Dulaglutide 3 MG/0.5ML SOPN Inject into the skin.    ? empagliflozin (JARDIANCE) 10 MG TABS tablet Take 1 tablet by mouth daily.    ? glimepiride (AMARYL) 4 MG tablet Take 1 tablet (4 mg total) by mouth daily. Holly Foley 30 tablet 36  ? hydrochlorothiazide (HYDRODIURIL) 12.5 MG tablet Take 1 tablet (12.5 mg total) by mouth daily. 90 tablet 1  ? losartan (COZAAR) 50 MG tablet Take 1 tablet (50 mg total) by mouth daily. 90 tablet 1  ? metFORMIN (GLUMETZA) 500 MG (MOD) 24 hr tablet Take 1,000 mg by mouth 2 (two) times daily with a meal.    ? promethazine (PHENERGAN) 25 MG suppository Place 1 suppository (25 mg total) rectally every 6 (six) hours as needed for nausea or  vomiting. 12 each 0  ? sertraline (ZOLOFT) 50 MG tablet Take 1 tablet (50 mg total) by mouth daily. Take 1 tablet daily 90 tablet 1  ? vitamin B-12 (CYANOCOBALAMIN) 1000 MCG tablet Take 1,000 mcg by mouth daily.    ? ?No current facility-administered medications for this visit.  ? ? ?Allergies as of 05/03/2022 - Review Complete 05/03/2022  ?Allergen Reaction Noted  ? Canagliflozin Rash 01/24/2018  ? ? ?ROS: ? ?General: Negative for anorexia, weight loss, fever, chills, fatigue, weakness. ?ENT: Negative for hoarseness, difficulty swallowing , nasal congestion. ?CV: Negative for chest pain, angina, palpitations, dyspnea on exertion, peripheral edema.  ?Respiratory: Negative for dyspnea at rest, dyspnea on exertion, cough, sputum, wheezing.  ?GI: See history of present illness. ?GU:  Negative for dysuria, hematuria, urinary incontinence, urinary frequency, nocturnal urination.  ?Endo: Negative for unusual weight change.  ?  ?Physical Examination: ? ? BP 138/85   Pulse 69   Temp 98.4 ?F (36.9 ?C) (Oral)   Ht 5' (1.524 m)   Wt 180 lb (81.6 kg)   BMI 35.15 kg/m?  ? ?General: Well-nourished, well-developed in no acute distress.  ?Eyes: No icterus. Conjunctivae pink. ?Neuro: Alert and oriented x 3.  Grossly intact. ?Skin: Warm and dry, no jaundice.   ?  Psych: Alert and cooperative, normal mood and affect. ? ?Labs:  ?  ?Imaging Studies: ?No results found. ? ?Assessment and Plan:  ? ?EVERETTE DIMAURO is a 56 y.o. y/o female with a history of gastroparesis.  The patient did do better on Reglan and has been given a prescription of 5 mg of Reglan to be taken only when she is having attacks and not on a regular basis and she has been told to take it only for 1 to 2 days and then stop.  The patient has been explained the risks of tardive dyskinesia and movement disorders.  The patient also has FMLA papers that need to be filled out and she will send them to [place] and we will fill them out.  The patient has been explained the plan  agrees with it. ? ? ? ? ?Midge Minium, MD. Clementeen Graham ? ? ? Note: This dictation was prepared with Dragon dictation along with smaller phrase technology. Any transcriptional errors that result from this process are unintentional.  ?

## 2022-05-03 ENCOUNTER — Encounter: Payer: Self-pay | Admitting: Gastroenterology

## 2022-05-03 ENCOUNTER — Ambulatory Visit (INDEPENDENT_AMBULATORY_CARE_PROVIDER_SITE_OTHER): Payer: Managed Care, Other (non HMO) | Admitting: Gastroenterology

## 2022-05-03 VITALS — BP 138/85 | HR 69 | Temp 98.4°F | Ht 60.0 in | Wt 180.0 lb

## 2022-05-03 DIAGNOSIS — K3184 Gastroparesis: Secondary | ICD-10-CM

## 2022-05-03 MED ORDER — METOCLOPRAMIDE HCL 5 MG PO TABS: 5.0000 mg | ORAL_TABLET | Freq: Four times a day (QID) | ORAL | 1 refills | Status: DC | PRN

## 2022-07-19 ENCOUNTER — Other Ambulatory Visit: Payer: Self-pay | Admitting: Family Medicine

## 2022-08-31 LAB — HEMOGLOBIN A1C: Hemoglobin A1C: 9.4

## 2022-09-10 LAB — MICROALBUMIN / CREATININE URINE RATIO: Microalb Creat Ratio: 6.5

## 2022-09-10 LAB — MICROALBUMIN, URINE: Microalb, Ur: 106.9

## 2022-11-01 ENCOUNTER — Ambulatory Visit (INDEPENDENT_AMBULATORY_CARE_PROVIDER_SITE_OTHER): Payer: Managed Care, Other (non HMO) | Admitting: Family Medicine

## 2022-11-01 ENCOUNTER — Encounter: Payer: Self-pay | Admitting: Family Medicine

## 2022-11-01 VITALS — BP 118/78 | HR 74 | Ht 60.0 in | Wt 185.0 lb

## 2022-11-01 DIAGNOSIS — I10 Essential (primary) hypertension: Secondary | ICD-10-CM | POA: Diagnosis not present

## 2022-11-01 DIAGNOSIS — F329 Major depressive disorder, single episode, unspecified: Secondary | ICD-10-CM

## 2022-11-01 DIAGNOSIS — F419 Anxiety disorder, unspecified: Secondary | ICD-10-CM

## 2022-11-01 MED ORDER — LOSARTAN POTASSIUM 50 MG PO TABS: 50.0000 mg | ORAL_TABLET | Freq: Every day | ORAL | 1 refills | Status: DC

## 2022-11-01 MED ORDER — BUSPIRONE HCL 5 MG PO TABS: 5.0000 mg | ORAL_TABLET | Freq: Two times a day (BID) | ORAL | 1 refills | Status: DC

## 2022-11-01 MED ORDER — HYDROCHLOROTHIAZIDE 12.5 MG PO TABS: 12.5000 mg | ORAL_TABLET | Freq: Every day | ORAL | 1 refills | Status: DC

## 2022-11-01 MED ORDER — SERTRALINE HCL 50 MG PO TABS: 50.0000 mg | ORAL_TABLET | Freq: Every day | ORAL | 1 refills | Status: DC

## 2022-11-01 NOTE — Patient Instructions (Signed)

## 2022-11-01 NOTE — Progress Notes (Signed)
Date:  11/01/2022   Name:  Holly Foley   DOB:  01-26-1966   MRN:  [id number]   Chief Complaint: Hypertension, Anxiety, and Depression  Hypertension This is a chronic problem. The current episode started more than 1 year ago. The problem has been gradually improving since onset. Associated symptoms include anxiety. Pertinent negatives include no chest pain, headaches, neck pain, orthopnea, palpitations, PND or shortness of breath. There are no associated agents to hypertension. There are no known risk factors for coronary artery disease. Past treatments include angiotensin blockers and diuretics. The current treatment provides moderate improvement. There are no compliance problems.  There is no history of angina, CAD/MI, CVA or heart failure.  Anxiety Presents for follow-up visit. Patient reports no chest pain, decreased concentration, depressed mood, dizziness, excessive worry, insomnia, irritability, nausea, nervous/anxious behavior, palpitations, panic, restlessness, shortness of breath or suicidal ideas.    Depression        This is a chronic problem.  The current episode started more than 1 year ago.   Associated symptoms include no decreased concentration, no fatigue, no helplessness, no hopelessness, does not have insomnia, not irritable, no restlessness, no decreased interest, no appetite change, no body aches, no myalgias, no headaches, no indigestion, not sad and no suicidal ideas.  Past treatments include SSRIs - Selective serotonin reuptake inhibitors.  Previous treatment provided moderate relief.  Past medical history includes anxiety.     Lab Results  Component Value Date   NA 135 03/01/2022   K 4.3 03/01/2022   CO2 24 03/01/2022   GLUCOSE 190 (H) 03/01/2022   BUN 15 03/01/2022   CREATININE 0.78 03/01/2022   CALCIUM 9.3 05/01/2022   EGFR 90 03/01/2022   GFRNONAA >60 02/23/2022   Lab Results  Component Value Date   CHOL 147 04/06/2021   HDL 47 04/06/2021   LDLCALC  82 04/06/2021   TRIG 87 04/06/2021   CHOLHDL 2.9 04/15/2017   Lab Results  Component Value Date   TSH 1.450 10/05/2020   Lab Results  Component Value Date   HGBA1C 9.4 08/31/2022   Lab Results  Component Value Date   WBC 14.7 (H) 02/23/2022   HGB 17.6 (H) 02/23/2022   HCT 50.6 (H) 02/23/2022   MCV 83.6 02/23/2022   PLT 317 02/23/2022   Lab Results  Component Value Date   ALT 19 02/23/2022   AST 25 02/23/2022   ALKPHOS 71 02/23/2022   BILITOT 1.3 (H) 02/23/2022   No results found for: "25OHVITD2", "25OHVITD3", "VD25OH"   Review of Systems  Constitutional:  Negative for appetite change, chills, fatigue, fever, irritability and unexpected weight change.  Eyes:  Negative for visual disturbance.  Respiratory:  Negative for cough, shortness of breath and wheezing.   Cardiovascular:  Negative for chest pain, palpitations, orthopnea, leg swelling and PND.  Gastrointestinal:  Negative for abdominal pain and nausea.  Endocrine: Negative for polydipsia.  Genitourinary:  Negative for difficulty urinating.  Musculoskeletal:  Negative for back pain, myalgias and neck pain.  Skin:  Negative for rash.  Allergic/Immunologic: Negative for environmental allergies.  Neurological:  Negative for dizziness and headaches.  Hematological:  Does not bruise/bleed easily.  Psychiatric/Behavioral:  Positive for depression. Negative for decreased concentration and suicidal ideas. The patient is not nervous/anxious and does not have insomnia.     Patient Active Problem List   Diagnosis Date Noted   Intractable vomiting    Special screening for malignant neoplasms, colon    Recurrent major depressive  disorder, in partial remission ([place]) 04/15/2017   Reactive depression 04/15/2017   Colon cancer screening 04/15/2017   Class 2 obesity with serious comorbidity and body mass index (BMI) of 39.0 to 39.9 in adult 04/15/2017   Recurrent ventral incisional hernia 10/62/6948   Periumbilical abdominal  pain 06/25/2016   HLD (hyperlipidemia) 01/31/2016   Essential hypertension 01/31/2016   Near syncope 01/31/2016   Fast heart beat 01/31/2016   B12 deficiency 08/31/2014   Diabetes mellitus ([place]) 08/31/2014   Type 2 diabetes mellitus ([place]) 08/24/2014    Allergies  Allergen Reactions   Canagliflozin Rash    Past Surgical History:  Procedure Laterality Date   ABLATION     CHOLECYSTECTOMY     COLONOSCOPY WITH PROPOFOL N/A 05/10/2017   Procedure: COLONOSCOPY WITH PROPOFOL;  Surgeon: Lucilla Lame, MD;  Location: [facility];  Service: Endoscopy;  Laterality: N/A;   ESOPHAGOGASTRODUODENOSCOPY (EGD) WITH PROPOFOL N/A 01/27/2021   Procedure: ESOPHAGOGASTRODUODENOSCOPY (EGD) WITH PROPOFOL;  Surgeon: Lucilla Lame, MD;  Location: [facility];  Service: Endoscopy;  Laterality: N/A;  Diabetic - oral meds   HERNIA REPAIR     PILONIDAL CYST EXCISION      Social History   Tobacco Use   Smoking status: Former    Types: Cigarettes    Quit date: 06/02/2022    Years since quitting: 0.4   Smokeless tobacco: Never  Vaping Use   Vaping Use: Never used  Substance Use Topics   Alcohol use: Yes    Alcohol/week: 0.0 standard drinks of alcohol    Comment: occasionally   Drug use: No     Medication list has been reviewed and updated.  Current Meds  Medication Sig   aspirin EC 81 MG tablet Take 1 tablet by mouth daily.   BD PEN NEEDLE NANO U/F 32G X 4 MM MISC    busPIRone (BUSPAR) 5 MG tablet Take 1 tablet (5 mg total) by mouth 2 (two) times daily.   Dulaglutide 3 MG/0.5ML SOPN Inject into the skin.   empagliflozin (JARDIANCE) 10 MG TABS tablet Take 1 tablet by mouth daily.   glimepiride (AMARYL) 4 MG tablet Take 1 tablet (4 mg total) by mouth daily. [name]   hydrochlorothiazide (HYDRODIURIL) 12.5 MG tablet Take 1 tablet (12.5 mg total) by mouth daily.   losartan (COZAAR) 50 MG tablet Take 1 tablet (50 mg total) by mouth daily.   metFORMIN (GLUMETZA) 500 MG (MOD) 24  hr tablet Take 1,000 mg by mouth 2 (two) times daily with a meal.   sertraline (ZOLOFT) 50 MG tablet Take 1 tablet (50 mg total) by mouth daily. Take 1 tablet daily   vitamin B-12 (CYANOCOBALAMIN) 1000 MCG tablet Take 1,000 mcg by mouth daily.       11/01/2022   10:31 AM 05/01/2022   10:04 AM 03/01/2022   10:13 AM 10/31/2021    8:05 AM  GAD 7 : Generalized Anxiety Score  Nervous, Anxious, on Edge 0 2 0 0  Control/stop worrying 0 0 0 0  Worry too much - different things 0 0 0 0  Trouble relaxing 0 0 0 0  Restless 0 0 0 0  Easily annoyed or irritable 0 1 0 0  Afraid - awful might happen 0 0 0 0  Total GAD 7 Score 0 3 0 0  Anxiety Difficulty Not difficult at all Not difficult at all Not difficult at all        11/01/2022   10:31 AM 05/01/2022   10:03  AM 03/01/2022   10:13 AM  Depression screen PHQ 2/9  Decreased Interest 0 0 0  Down, Depressed, Hopeless 0 0 0  PHQ - 2 Score 0 0 0  Altered sleeping 0 0 0  Tired, decreased energy 0 0 0  Change in appetite 0 1 0  Feeling bad or failure about yourself  0 0 0  Trouble concentrating 0 0 0  Moving slowly or fidgety/restless 0 0 0  Suicidal thoughts 0 0 0  PHQ-9 Score 0 1 0  Difficult doing work/chores Not difficult at all Not difficult at all     BP Readings from Last 3 Encounters:  11/01/22 118/78  05/03/22 138/85  05/01/22 110/60    Physical Exam Vitals and nursing note reviewed. Exam conducted with a chaperone present.  Constitutional:      General: She is not irritable.She is not in acute distress.    Appearance: She is not diaphoretic.  HENT:     Head: Normocephalic and atraumatic.     Right Ear: External ear normal.     Left Ear: External ear normal.     Nose: Nose normal.     Mouth/Throat:     Mouth: Mucous membranes are moist.  Eyes:     General:        Right eye: No discharge.        Left eye: No discharge.     Conjunctiva/sclera: Conjunctivae normal.     Pupils: Pupils are equal, round, and reactive to light.   Neck:     Thyroid: No thyromegaly.     Vascular: No JVD.  Cardiovascular:     Rate and Rhythm: Normal rate and regular rhythm.     Heart sounds: Normal heart sounds. No murmur heard.    No friction rub. No gallop.  Pulmonary:     Effort: Pulmonary effort is normal.     Breath sounds: Normal breath sounds. No wheezing or rhonchi.  Abdominal:     General: Bowel sounds are normal.     Palpations: Abdomen is soft. There is no mass.     Tenderness: There is no abdominal tenderness. There is no guarding.  Musculoskeletal:        General: Normal range of motion.     Cervical back: Neck supple.  Lymphadenopathy:     Cervical: No cervical adenopathy.  Skin:    General: Skin is warm and dry.  Neurological:     Mental Status: She is alert.     Wt Readings from Last 3 Encounters:  11/01/22 185 lb (83.9 kg)  05/03/22 180 lb (81.6 kg)  05/01/22 177 lb (80.3 kg)    BP 118/78   Pulse 74   Ht 5' (1.524 m)   Wt 185 lb (83.9 kg)   SpO2 97%   BMI 36.13 kg/m   Assessment and Plan:  1. Essential hypertension Chronic.  Controlled.  Stable.  Blood pressure 118/78.  Asymptomatic.  Tolerating medications well.  We will continue hydrochlorothiazide 12.5 mg once a day and losartan 50 mg once a day. - hydrochlorothiazide (HYDRODIURIL) 12.5 MG tablet; Take 1 tablet (12.5 mg total) by mouth daily.  Dispense: 90 tablet; Refill: 1 - losartan (COZAAR) 50 MG tablet; Take 1 tablet (50 mg total) by mouth daily.  Dispense: 90 tablet; Refill: 1  2. Anxiety Chronic.  Controlled.  Stable.  GAD score is 0.  Continue buspirone 1 tablet twice a day.  And will recheck in 6 months. - busPIRone (BUSPAR) 5 MG  tablet; Take 1 tablet (5 mg total) by mouth 2 (two) times daily.  Dispense: 180 tablet; Refill: 1  3. Reactive depression Chronic.  Continue controlled.  Stable.  PHQ is 0.  Continue sertraline 50 mg once a day.  We will recheck in 6 months. - sertraline (ZOLOFT) 50 MG tablet; Take 1 tablet (50 mg total)  by mouth daily. Take 1 tablet daily  Dispense: 90 tablet; Refill: 1    Otilio Miu, MD

## 2023-01-18 LAB — HEMOGLOBIN A1C: Hemoglobin A1C: 6.9

## 2023-01-31 ENCOUNTER — Encounter: Payer: Self-pay | Admitting: Gastroenterology

## 2023-01-31 ENCOUNTER — Telehealth: Payer: Self-pay | Admitting: Gastroenterology

## 2023-01-31 NOTE — Telephone Encounter (Signed)
Pt has an appointment for May but has FMLA paper work that needs to be filled out by the end of March callback [phone]

## 2023-01-31 NOTE — Telephone Encounter (Signed)
Pt will attach FMLA ppw in [place] to be filled out and is aware that she will need to keep upcoming appt for updated progress notes to support

## 2023-02-08 LAB — RESULTS CONSOLE HPV: CHL HPV: NEGATIVE

## 2023-02-08 LAB — HM PAP SMEAR: HM Pap smear: NORMAL

## 2023-02-19 LAB — HM DIABETES EYE EXAM

## 2023-04-25 ENCOUNTER — Encounter: Payer: Self-pay | Admitting: Gastroenterology

## 2023-04-25 ENCOUNTER — Ambulatory Visit: Payer: Managed Care, Other (non HMO) | Admitting: Gastroenterology

## 2023-04-25 VITALS — BP 117/72 | HR 103 | Temp 98.2°F | Wt 182.5 lb

## 2023-04-25 DIAGNOSIS — K3184 Gastroparesis: Secondary | ICD-10-CM | POA: Diagnosis not present

## 2023-04-25 MED ORDER — METOCLOPRAMIDE HCL 5 MG PO TABS: 5.0000 mg | ORAL_TABLET | Freq: Four times a day (QID) | ORAL | 1 refills | Status: AC | PRN

## 2023-04-25 NOTE — Progress Notes (Signed)
Primary Care Physician: Duanne Limerick, MD  Primary Gastroenterologist:  Dr. Midge Minium  Chief Complaint  Patient presents with   Follow-up   FMLA    PPW filled out and faxed 01/31/23    HPI: Holly Foley is a 57 y.o. female here for follow-up of gastroparesis and to have her FMLA papers filled out.  The patient had an upper endoscopy with retained food and a gastric emptying study due to the retained food that could not be completed because the patient kept vomiting up the food prior to completing the test.  The patient had her forms filled out previously by her primary care provider who now recommends that she have her at [facility] forms filled out by [place]. The patient reports that she has had only had a few attacks of her nausea vomiting but has taken the Reglan when she has these attacks and she is doing much better with her diet.  Past Medical History:  Diagnosis Date   Class 2 obesity with serious comorbidity and body mass index (BMI) of 39.0 to 39.9 in adult 04/15/2017   Depression    HLD (hyperlipidemia) 01/31/2016   Hypertension    Recurrent major depressive disorder, in partial remission ([facility]) 04/15/2017   Type 2 diabetes mellitus ([facility]) 08/24/2014    Current Outpatient Medications  Medication Sig Dispense Refill   aspirin EC 81 MG tablet Take 1 tablet by mouth daily.     BD PEN NEEDLE NANO U/F 32G X 4 MM MISC   0   busPIRone (BUSPAR) 5 MG tablet Take 1 tablet (5 mg total) by mouth 2 (two) times daily. 180 tablet 1   Dulaglutide 3 MG/0.5ML SOPN Inject into the skin.     empagliflozin (JARDIANCE) 10 MG TABS tablet Take 1 tablet by mouth daily.     glimepiride (AMARYL) 4 MG tablet Take 1 tablet (4 mg total) by mouth daily. [name] 30 tablet 36   hydrochlorothiazide (HYDRODIURIL) 12.5 MG tablet Take 1 tablet (12.5 mg total) by mouth daily. 90 tablet 1   losartan (COZAAR) 50 MG tablet Take 1 tablet (50 mg total) by mouth daily. 90 tablet 1   metFORMIN (GLUMETZA) 500 MG  (MOD) 24 hr tablet Take 1,000 mg by mouth 2 (two) times daily with a meal.     metoCLOPramide (REGLAN) 5 MG tablet Take 1 tablet (5 mg total) by mouth every 6 (six) hours as needed for nausea. 60 tablet 1   promethazine (PHENERGAN) 25 MG suppository Place 1 suppository (25 mg total) rectally every 6 (six) hours as needed for nausea or vomiting. 12 each 0   sertraline (ZOLOFT) 50 MG tablet Take 1 tablet (50 mg total) by mouth daily. Take 1 tablet daily 90 tablet 1   vitamin B-12 (CYANOCOBALAMIN) 1000 MCG tablet Take 1,000 mcg by mouth daily.     No current facility-administered medications for this visit.    Allergies as of 04/25/2023 - Review Complete 04/25/2023  Allergen Reaction Noted   Canagliflozin Rash 01/24/2018    ROS:  General: Negative for anorexia, weight loss, fever, chills, fatigue, weakness. ENT: Negative for hoarseness, difficulty swallowing , nasal congestion. CV: Negative for chest pain, angina, palpitations, dyspnea on exertion, peripheral edema.  Respiratory: Negative for dyspnea at rest, dyspnea on exertion, cough, sputum, wheezing.  GI: See history of present illness. GU:  Negative for dysuria, hematuria, urinary incontinence, urinary frequency, nocturnal urination.  Endo: Negative for unusual weight change.    Physical Examination:  BP 117/72 (BP Location: Left Arm, Patient Position: Sitting, Cuff Size: Normal)   Pulse (!) 103   Temp 98.2 F (36.8 C) (Oral)   Wt 182 lb 8 oz (82.8 kg)   BMI 35.64 kg/m   General: Well-nourished, well-developed in no acute distress.  Eyes: No icterus. Conjunctivae pink. Neuro: Alert and oriented x 3.  Grossly intact. Skin: Warm and dry, no jaundice.   Psych: Alert and cooperative, normal mood and affect.  Labs:    Imaging Studies: No results found.  Assessment and Plan:   Holly Foley is a 57 y.o. y/o female who comes in today with a history of nausea vomiting and suspected gastroparesis.  The patient will need a  refill of her Reglan.  The patient has her FMLA papers already filled out and will contact us in February when they expire again.  The patient will be given Reglan 60 tablets to be taken as needed for short periods of time.  The patient has been explained the plan and agrees with it.     Midge Minium, MD. Clementeen Graham    Note: This dictation was prepared with Dragon dictation along with smaller phrase technology. Any transcriptional errors that result from this process are unintentional.

## 2023-04-25 NOTE — Addendum Note (Signed)
Addended by: Roena Malady on: 04/25/2023 01:18 PM   Modules accepted: Orders

## 2023-05-02 ENCOUNTER — Encounter: Payer: Self-pay | Admitting: Family Medicine

## 2023-05-02 ENCOUNTER — Ambulatory Visit (INDEPENDENT_AMBULATORY_CARE_PROVIDER_SITE_OTHER): Payer: Managed Care, Other (non HMO) | Admitting: Family Medicine

## 2023-05-02 VITALS — BP 120/70 | HR 76 | Ht 60.0 in | Wt 180.0 lb

## 2023-05-02 DIAGNOSIS — F329 Major depressive disorder, single episode, unspecified: Secondary | ICD-10-CM

## 2023-05-02 DIAGNOSIS — E782 Mixed hyperlipidemia: Secondary | ICD-10-CM

## 2023-05-02 DIAGNOSIS — I10 Essential (primary) hypertension: Secondary | ICD-10-CM

## 2023-05-02 DIAGNOSIS — Z23 Encounter for immunization: Secondary | ICD-10-CM | POA: Diagnosis not present

## 2023-05-02 DIAGNOSIS — F419 Anxiety disorder, unspecified: Secondary | ICD-10-CM

## 2023-05-02 MED ORDER — BUSPIRONE HCL 5 MG PO TABS: 5.0000 mg | ORAL_TABLET | Freq: Two times a day (BID) | ORAL | 1 refills | Status: DC

## 2023-05-02 MED ORDER — HYDROCHLOROTHIAZIDE 12.5 MG PO TABS: 12.5000 mg | ORAL_TABLET | Freq: Every day | ORAL | 1 refills | Status: DC

## 2023-05-02 MED ORDER — SERTRALINE HCL 50 MG PO TABS: 50.0000 mg | ORAL_TABLET | Freq: Every day | ORAL | 1 refills | Status: DC

## 2023-05-02 MED ORDER — LOSARTAN POTASSIUM 50 MG PO TABS: 50.0000 mg | ORAL_TABLET | Freq: Every day | ORAL | 1 refills | Status: DC

## 2023-05-02 NOTE — Progress Notes (Signed)
Date:  05/02/2023   Name:  Holly Foley   DOB:  12-19-1966   MRN:  [id number]   Chief Complaint: tdap vaccine, Anxiety, Hypertension, and Depression  Anxiety Presents for follow-up visit. Patient reports no chest pain, compulsions, confusion, decreased concentration, depressed mood, dizziness, dry mouth, excessive worry, hyperventilation, impotence, insomnia, irritability, nausea, nervous/anxious behavior, palpitations, panic, restlessness, shortness of breath or suicidal ideas. Symptoms occur occasionally. The severity of symptoms is mild.    Hypertension This is a chronic problem. The current episode started more than 1 year ago. The problem has been gradually improving since onset. Associated symptoms include anxiety. Pertinent negatives include no chest pain, orthopnea, palpitations, PND or shortness of breath. There are no associated agents to hypertension. Past treatments include angiotensin blockers and diuretics. The current treatment provides moderate improvement. There are no compliance problems.  There is no history of CAD/MI or CVA. There is no history of chronic renal disease, a hypertension causing med or renovascular disease.  Depression        This is a chronic problem.  The current episode started more than 1 year ago.   The onset quality is gradual.   The problem occurs intermittently.  The problem has been gradually improving since onset.  Associated symptoms include no decreased concentration, no fatigue, no helplessness, no hopelessness, does not have insomnia, no restlessness, no appetite change, no indigestion and no suicidal ideas.  Past treatments include SSRIs - Selective serotonin reuptake inhibitors.  Previous treatment provided moderate relief.  Past medical history includes anxiety.     Lab Results  Component Value Date   NA 135 03/01/2022   K 4.3 03/01/2022   CO2 24 03/01/2022   GLUCOSE 190 (H) 03/01/2022   BUN 15 03/01/2022   CREATININE 0.78 03/01/2022    CALCIUM 9.3 05/01/2022   EGFR 90 03/01/2022   GFRNONAA >60 02/23/2022   Lab Results  Component Value Date   CHOL 147 04/06/2021   HDL 47 04/06/2021   LDLCALC 82 04/06/2021   TRIG 87 04/06/2021   CHOLHDL 2.9 04/15/2017   Lab Results  Component Value Date   TSH 1.450 10/05/2020   Lab Results  Component Value Date   HGBA1C 6.9 01/18/2023   Lab Results  Component Value Date   WBC 14.7 (H) 02/23/2022   HGB 17.6 (H) 02/23/2022   HCT 50.6 (H) 02/23/2022   MCV 83.6 02/23/2022   PLT 317 02/23/2022   Lab Results  Component Value Date   ALT 19 02/23/2022   AST 25 02/23/2022   ALKPHOS 71 02/23/2022   BILITOT 1.3 (H) 02/23/2022   No results found for: "25OHVITD2", "25OHVITD3", "VD25OH"   Review of Systems  Constitutional:  Negative for appetite change, fatigue, irritability and unexpected weight change.  HENT:  Negative for sore throat and trouble swallowing.   Eyes:  Negative for visual disturbance.  Respiratory:  Negative for choking, shortness of breath and wheezing.   Cardiovascular:  Negative for chest pain, palpitations, orthopnea, leg swelling and PND.  Gastrointestinal:  Negative for abdominal distention, blood in stool, constipation, diarrhea and nausea.  Endocrine: Negative for polydipsia and polyuria.  Genitourinary:  Negative for impotence, menstrual problem and vaginal discharge.  Neurological:  Negative for dizziness.  Hematological:  Negative for adenopathy. Does not bruise/bleed easily.  Psychiatric/Behavioral:  Positive for depression. Negative for confusion, decreased concentration and suicidal ideas. The patient is not nervous/anxious and does not have insomnia.     Patient Active Problem List  Diagnosis Date Noted   Intractable vomiting    Special screening for malignant neoplasms, colon    Recurrent major depressive disorder, in partial remission (HCC) 04/15/2017   Reactive depression 04/15/2017   Colon cancer screening 04/15/2017   Class 2 obesity  with serious comorbidity and body mass index (BMI) of 39.0 to 39.9 in adult 04/15/2017   Recurrent ventral incisional hernia 07/03/2016   Periumbilical abdominal pain 06/25/2016   HLD (hyperlipidemia) 01/31/2016   Essential hypertension 01/31/2016   Near syncope 01/31/2016   Fast heart beat 01/31/2016   B12 deficiency 08/31/2014   Diabetes mellitus (HCC) 08/31/2014   Type 2 diabetes mellitus (HCC) 08/24/2014    Allergies  Allergen Reactions   Canagliflozin Rash    Past Surgical History:  Procedure Laterality Date   ABLATION     CHOLECYSTECTOMY     COLONOSCOPY WITH PROPOFOL N/A 05/10/2017   Procedure: COLONOSCOPY WITH PROPOFOL;  Surgeon: Midge Minium, MD;  Location: [facility];  Service: Endoscopy;  Laterality: N/A;   ESOPHAGOGASTRODUODENOSCOPY (EGD) WITH PROPOFOL N/A 01/27/2021   Procedure: ESOPHAGOGASTRODUODENOSCOPY (EGD) WITH PROPOFOL;  Surgeon: Midge Minium, MD;  Location: [facility];  Service: Endoscopy;  Laterality: N/A;  Diabetic - oral meds   HERNIA REPAIR     PILONIDAL CYST EXCISION      Social History   Tobacco Use   Smoking status: Former    Types: Cigarettes    Quit date: 06/02/2022    Years since quitting: 0.9   Smokeless tobacco: Never  Vaping Use   Vaping Use: Never used  Substance Use Topics   Alcohol use: Yes    Alcohol/week: 0.0 standard drinks of alcohol    Comment: occasionally   Drug use: No     Medication list has been reviewed and updated.  Current Meds  Medication Sig   aspirin EC 81 MG tablet Take 1 tablet by mouth daily.   BD PEN NEEDLE NANO U/F 32G X 4 MM MISC    busPIRone (BUSPAR) 5 MG tablet Take 1 tablet (5 mg total) by mouth 2 (two) times daily.   empagliflozin (JARDIANCE) 10 MG TABS tablet Take 1 tablet by mouth daily.   glimepiride (AMARYL) 4 MG tablet Take 1 tablet (4 mg total) by mouth daily. Holly Foley   hydrochlorothiazide (HYDRODIURIL) 12.5 MG tablet Take 1 tablet (12.5 mg total) by mouth daily.    losartan (COZAAR) 50 MG tablet Take 1 tablet (50 mg total) by mouth daily.   metFORMIN (GLUMETZA) 500 MG (MOD) 24 hr tablet Take 1,000 mg by mouth 2 (two) times daily with a meal.   metoCLOPramide (REGLAN) 5 MG tablet Take 1 tablet (5 mg total) by mouth every 6 (six) hours as needed for nausea.   MOUNJARO 5 MG/0.5ML Pen O'Connell   sertraline (ZOLOFT) 50 MG tablet Take 1 tablet (50 mg total) by mouth daily. Take 1 tablet daily   TRESIBA FLEXTOUCH 100 UNIT/ML FlexTouch Pen Inject into the skin. O'Connell   vitamin B-12 (CYANOCOBALAMIN) 1000 MCG tablet Take 1,000 mcg by mouth daily.   [DISCONTINUED] Dulaglutide 3 MG/0.5ML SOPN Inject into the skin.       05/02/2023    9:54 AM 11/01/2022   10:31 AM 05/01/2022   10:04 AM 03/01/2022   10:13 AM  GAD 7 : Generalized Anxiety Score  Nervous, Anxious, on Edge 0 0 2 0  Control/stop worrying 0 0 0 0  Worry too much - different things 0 0 0 0  Trouble relaxing 0 0 0  0  Restless 0 0 0 0  Easily annoyed or irritable 0 0 1 0  Afraid - awful might happen 0 0 0 0  Total GAD 7 Score 0 0 3 0  Anxiety Difficulty Not difficult at all Not difficult at all Not difficult at all Not difficult at all       05/02/2023    9:54 AM 11/01/2022   10:31 AM 05/01/2022   10:03 AM  Depression screen PHQ 2/9  Decreased Interest 0 0 0  Down, Depressed, Hopeless 0 0 0  PHQ - 2 Score 0 0 0  Altered sleeping 0 0 0  Tired, decreased energy 0 0 0  Change in appetite 0 0 1  Feeling bad or failure about yourself  0 0 0  Trouble concentrating 0 0 0  Moving slowly or fidgety/restless 0 0 0  Suicidal thoughts 0 0 0  PHQ-9 Score 0 0 1  Difficult doing work/chores Not difficult at all Not difficult at all Not difficult at all    BP Readings from Last 3 Encounters:  05/02/23 120/70  04/25/23 117/72  11/01/22 118/78    Physical Exam Vitals and nursing note reviewed.  HENT:     Right Ear: Tympanic membrane and external ear normal. There is no impacted cerumen.     Left Ear:  Tympanic membrane and external ear normal. There is no impacted cerumen.     Nose: Nose normal.     Mouth/Throat:     Mouth: Mucous membranes are moist.     Pharynx: No oropharyngeal exudate or posterior oropharyngeal erythema.  Eyes:     Conjunctiva/sclera: Conjunctivae normal.     Pupils: Pupils are equal, round, and reactive to light.  Cardiovascular:     Heart sounds: No murmur heard.    No friction rub. No gallop.  Pulmonary:     Effort: No respiratory distress.     Breath sounds: No stridor. No wheezing, rhonchi or rales.  Chest:     Chest wall: No tenderness.  Abdominal:     General: Abdomen is flat.     Tenderness: There is no abdominal tenderness. There is no guarding.     Hernia: No hernia is present.  Musculoskeletal:     Cervical back: Normal range of motion.     Wt Readings from Last 3 Encounters:  05/02/23 180 lb (81.6 kg)  04/25/23 182 lb 8 oz (82.8 kg)  11/01/22 185 lb (83.9 kg)    BP 120/70   Pulse 76   Ht 5' (1.524 m)   Wt 180 lb (81.6 kg)   SpO2 95%   BMI 35.15 kg/m   Assessment and Plan: 1. Essential hypertension Chronic.  Controlled.  Stable.  Blood pressure today is 120/70.  Asymptomatic.  Tolerating medications well.  Continue hydrochlorothiazide 12.5 mg once a day and losartan 50 mg once a day.  Will check CMP for electrolytes and GFR.  Will recheck patient in 6 months. - hydrochlorothiazide (HYDRODIURIL) 12.5 MG tablet; Take 1 tablet (12.5 mg total) by mouth daily.  Dispense: 90 tablet; Refill: 1 - losartan (COZAAR) 50 MG tablet; Take 1 tablet (50 mg total) by mouth daily.  Dispense: 90 tablet; Refill: 1 - Comprehensive Metabolic Panel (CMET)  2. Anxiety Chronic.  Controlled.  Stable.  GAD score 0.  Continue buspirone 5 mg twice a day.  Will recheck in 6 months. - busPIRone (BUSPAR) 5 MG tablet; Take 1 tablet (5 mg total) by mouth 2 (two) times daily.  Dispense: 180 tablet; Refill: 1  3. Reactive depression Chronic.  Controlled.  Stable.   PHQ is 0.  Continue sertraline 50 mg once a day.  Will recheck in 6 months. - sertraline (ZOLOFT) 50 MG tablet; Take 1 tablet (50 mg total) by mouth daily. Take 1 tablet daily  Dispense: 90 tablet; Refill: 1  4. Moderate mixed hyperlipidemia not requiring statin therapy Chronic.  Controlled.  Stable.  Diet controlled.  Will check lipid panel for current status of LDL. - Lipid Panel With LDL/HDL Ratio  5. Need for diphtheria-tetanus-pertussis (Tdap) vaccine Discussed and administered. - Tdap vaccine greater than or equal to 7yo IM     Elizabeth Sauer, MD

## 2023-05-03 LAB — COMPREHENSIVE METABOLIC PANEL
ALT: 10 IU/L (ref 0–32)
AST: 14 IU/L (ref 0–40)
Albumin/Globulin Ratio: 1.3 (ref 1.2–2.2)
Albumin: 4.3 g/dL (ref 3.8–4.9)
Alkaline Phosphatase: 112 IU/L (ref 44–121)
BUN/Creatinine Ratio: 27 — ABNORMAL HIGH (ref 9–23)
BUN: 23 mg/dL (ref 6–24)
Bilirubin Total: 0.4 mg/dL (ref 0.0–1.2)
CO2: 19 mmol/L — ABNORMAL LOW (ref 20–29)
Calcium: 10.6 mg/dL — ABNORMAL HIGH (ref 8.7–10.2)
Chloride: 94 mmol/L — ABNORMAL LOW (ref 96–106)
Creatinine, Ser: 0.86 mg/dL (ref 0.57–1.00)
Globulin, Total: 3.3 g/dL (ref 1.5–4.5)
Glucose: 246 mg/dL — ABNORMAL HIGH (ref 70–99)
Potassium: 4.2 mmol/L (ref 3.5–5.2)
Sodium: 133 mmol/L — ABNORMAL LOW (ref 134–144)
Total Protein: 7.6 g/dL (ref 6.0–8.5)
eGFR: 79 mL/min/{1.73_m2} (ref >59)

## 2023-05-03 LAB — LIPID PANEL WITH LDL/HDL RATIO
Cholesterol, Total: 163 mg/dL (ref 100–199)
HDL: 52 mg/dL (ref >39)
LDL Chol Calc (NIH): 93 mg/dL (ref 0–99)
LDL/HDL Ratio: 1.8 ratio (ref 0.0–3.2)
Triglycerides: 95 mg/dL (ref 0–149)
VLDL Cholesterol Cal: 18 mg/dL (ref 5–40)

## 2023-05-09 ENCOUNTER — Encounter: Payer: Self-pay | Admitting: Family Medicine

## 2023-10-18 LAB — HEMOGLOBIN A1C: Hemoglobin A1C: 7.1

## 2023-11-04 ENCOUNTER — Ambulatory Visit (INDEPENDENT_AMBULATORY_CARE_PROVIDER_SITE_OTHER): Payer: Managed Care, Other (non HMO) | Admitting: Family Medicine

## 2023-11-04 ENCOUNTER — Encounter: Payer: Self-pay | Admitting: Family Medicine

## 2023-11-04 VITALS — BP 118/70 | HR 94 | Ht 60.0 in | Wt 178.0 lb

## 2023-11-04 DIAGNOSIS — F419 Anxiety disorder, unspecified: Secondary | ICD-10-CM | POA: Diagnosis not present

## 2023-11-04 DIAGNOSIS — F329 Major depressive disorder, single episode, unspecified: Secondary | ICD-10-CM | POA: Diagnosis not present

## 2023-11-04 DIAGNOSIS — I10 Essential (primary) hypertension: Secondary | ICD-10-CM

## 2023-11-04 MED ORDER — LOSARTAN POTASSIUM 50 MG PO TABS: 50.0000 mg | ORAL_TABLET | Freq: Every day | ORAL | 1 refills | Status: DC

## 2023-11-04 MED ORDER — SERTRALINE HCL 50 MG PO TABS: 50.0000 mg | ORAL_TABLET | Freq: Every day | ORAL | 1 refills | Status: DC

## 2023-11-04 MED ORDER — HYDROCHLOROTHIAZIDE 12.5 MG PO TABS: 12.5000 mg | ORAL_TABLET | Freq: Every day | ORAL | 1 refills | Status: DC

## 2023-11-04 MED ORDER — BUSPIRONE HCL 5 MG PO TABS: 5.0000 mg | ORAL_TABLET | Freq: Two times a day (BID) | ORAL | 1 refills | Status: DC

## 2023-11-04 NOTE — Progress Notes (Signed)
Date:  11/04/2023   Name:  Holly Foley   DOB:  [birth year]   MRN:  [id number]   Chief Complaint: Hypertension and Depression  Hypertension This is a chronic problem. The current episode started more than 1 year ago. The problem has been waxing and waning since onset. The problem is controlled. Pertinent negatives include no anxiety, blurred vision, chest pain, headaches, malaise/fatigue, neck pain, orthopnea, palpitations, peripheral edema, PND, shortness of breath or sweats. There are no associated agents to hypertension. There are no known risk factors for coronary artery disease. Past treatments include diuretics and angiotensin blockers. The current treatment provides moderate improvement. There are no compliance problems.  There is no history of CAD/MI or CVA. There is no history of chronic renal disease, a hypertension causing med or renovascular disease.  Depression        This is a chronic problem.  The current episode started more than 1 year ago.   The problem has been gradually improving since onset.  Associated symptoms include no decreased concentration, no fatigue, no helplessness, no hopelessness, does not have insomnia, not irritable, no restlessness, no decreased interest, no appetite change, no body aches, no myalgias, no headaches, no indigestion, not sad and no suicidal ideas.  Past treatments include SSRIs - Selective serotonin reuptake inhibitors (buspirine).  Previous treatment provided moderate relief.   Pertinent negatives include no anxiety.   Lab Results  Component Value Date   NA 133 (L) 05/02/2023   K 4.2 05/02/2023   CO2 19 (L) 05/02/2023   GLUCOSE 246 (H) 05/02/2023   BUN 23 05/02/2023   CREATININE 0.86 05/02/2023   CALCIUM 10.6 (H) 05/02/2023   EGFR 79 05/02/2023   GFRNONAA >60 02/23/2022   Lab Results  Component Value Date   CHOL 163 05/02/2023   HDL 52 05/02/2023   LDLCALC 93 05/02/2023   TRIG 95 05/02/2023   CHOLHDL 2.9 04/15/2017   Lab  Results  Component Value Date   TSH 1.450 10/05/2020   Lab Results  Component Value Date   HGBA1C 7.1 10/18/2023   Lab Results  Component Value Date   WBC 14.7 (H) 02/23/2022   HGB 17.6 (H) 02/23/2022   HCT 50.6 (H) 02/23/2022   MCV 83.6 02/23/2022   PLT 317 02/23/2022   Lab Results  Component Value Date   ALT 10 05/02/2023   AST 14 05/02/2023   ALKPHOS 112 05/02/2023   BILITOT 0.4 05/02/2023   No results found for: "25OHVITD2", "25OHVITD3", "VD25OH"   Review of Systems  Constitutional:  Negative for appetite change, fatigue, malaise/fatigue and unexpected weight change.  HENT:  Negative for congestion.   Eyes:  Negative for blurred vision and visual disturbance.  Respiratory:  Negative for cough, chest tightness, shortness of breath and wheezing.   Cardiovascular:  Negative for chest pain, palpitations, orthopnea and PND.  Gastrointestinal:  Negative for abdominal distention.  Endocrine: Negative for polydipsia and polyuria.  Genitourinary:  Negative for difficulty urinating and hematuria.  Musculoskeletal:  Negative for myalgias and neck pain.  Neurological:  Negative for headaches.  Psychiatric/Behavioral:  Positive for depression. Negative for decreased concentration and suicidal ideas. The patient does not have insomnia.     Patient Active Problem List   Diagnosis Date Noted   Intractable vomiting    Special screening for malignant neoplasms, colon    Recurrent major depressive disorder, in partial remission (HCC) 04/15/2017   Reactive depression 04/15/2017   Colon cancer screening 04/15/2017   Class 2  obesity with serious comorbidity and body mass index (BMI) of 39.0 to 39.9 in adult 04/15/2017   Recurrent ventral incisional hernia 07/03/2016   Periumbilical abdominal pain 06/25/2016   HLD (hyperlipidemia) 01/31/2016   Essential hypertension 01/31/2016   Near syncope 01/31/2016   Fast heart beat 01/31/2016   B12 deficiency 08/31/2014   Diabetes mellitus  (HCC) 08/31/2014   Type 2 diabetes mellitus (HCC) 08/24/2014    Allergies  Allergen Reactions   Canagliflozin Rash    Past Surgical History:  Procedure Laterality Date   ABLATION     CHOLECYSTECTOMY     COLONOSCOPY WITH PROPOFOL N/A 05/10/2017   Procedure: COLONOSCOPY WITH PROPOFOL;  Surgeon: Midge Minium, MD;  Location: [facility];  Service: Endoscopy;  Laterality: N/A;   ESOPHAGOGASTRODUODENOSCOPY (EGD) WITH PROPOFOL N/A 01/27/2021   Procedure: ESOPHAGOGASTRODUODENOSCOPY (EGD) WITH PROPOFOL;  Surgeon: Midge Minium, MD;  Location: [facility];  Service: Endoscopy;  Laterality: N/A;  Diabetic - oral meds   HERNIA REPAIR     PILONIDAL CYST EXCISION      Social History   Tobacco Use   Smoking status: Former    Current packs/day: 0.00    Types: Cigarettes    Quit date: 06/02/2022    Years since quitting: 1.4   Smokeless tobacco: Never  Vaping Use   Vaping status: Never Used  Substance Use Topics   Alcohol use: Yes    Alcohol/week: 0.0 standard drinks of alcohol    Comment: occasionally   Drug use: No     Medication list has been reviewed and updated.  Current Meds  Medication Sig   aspirin EC 81 MG tablet Take 1 tablet by mouth daily.   BD PEN NEEDLE NANO U/F 32G X 4 MM MISC    busPIRone (BUSPAR) 5 MG tablet Take 1 tablet (5 mg total) by mouth 2 (two) times daily.   empagliflozin (JARDIANCE) 10 MG TABS tablet Take 1 tablet by mouth daily.   glimepiride (AMARYL) 4 MG tablet Take 1 tablet (4 mg total) by mouth daily. [name]   hydrochlorothiazide (HYDRODIURIL) 12.5 MG tablet Take 1 tablet (12.5 mg total) by mouth daily.   losartan (COZAAR) 50 MG tablet Take 1 tablet (50 mg total) by mouth daily.   metFORMIN (GLUMETZA) 500 MG (MOD) 24 hr tablet Take 1,000 mg by mouth 2 (two) times daily with a meal.   metoCLOPramide (REGLAN) 5 MG tablet Take 1 tablet (5 mg total) by mouth every 6 (six) hours as needed for nausea.   MOUNJARO 5 MG/0.5ML Pen  O'Connell   promethazine (PHENERGAN) 25 MG suppository Place 1 suppository (25 mg total) rectally every 6 (six) hours as needed for nausea or vomiting.   sertraline (ZOLOFT) 50 MG tablet Take 1 tablet (50 mg total) by mouth daily. Take 1 tablet daily   TRESIBA FLEXTOUCH 100 UNIT/ML FlexTouch Pen Inject into the skin. O'Connell   vitamin B-12 (CYANOCOBALAMIN) 1000 MCG tablet Take 1,000 mcg by mouth daily.       11/04/2023   10:10 AM 05/02/2023    9:54 AM 11/01/2022   10:31 AM 05/01/2022   10:04 AM  GAD 7 : Generalized Anxiety Score  Nervous, Anxious, on Edge 0 0 0 2  Control/stop worrying 0 0 0 0  Worry too much - different things 0 0 0 0  Trouble relaxing 0 0 0 0  Restless 0 0 0 0  Easily annoyed or irritable 1 0 0 1  Afraid - awful might happen 0 0  0 0  Total GAD 7 Score 1 0 0 3  Anxiety Difficulty Not difficult at all Not difficult at all Not difficult at all Not difficult at all       11/04/2023   10:10 AM 05/02/2023    9:54 AM 11/01/2022   10:31 AM  Depression screen PHQ 2/9  Decreased Interest 1 0 0  Down, Depressed, Hopeless 0 0 0  PHQ - 2 Score 1 0 0  Altered sleeping 0 0 0  Tired, decreased energy 1 0 0  Change in appetite 1 0 0  Feeling bad or failure about yourself  0 0 0  Trouble concentrating 0 0 0  Moving slowly or fidgety/restless 0 0 0  Suicidal thoughts 0 0 0  PHQ-9 Score 3 0 0  Difficult doing work/chores Not difficult at all Not difficult at all Not difficult at all    BP Readings from Last 3 Encounters:  11/04/23 118/70  05/02/23 120/70  04/25/23 117/72    Physical Exam Vitals and nursing note reviewed. Exam conducted with a chaperone present.  Constitutional:      General: She is not irritable.She is not in acute distress.    Appearance: She is not diaphoretic.  HENT:     Head: Normocephalic and atraumatic.     Right Ear: Tympanic membrane and external ear normal.     Left Ear: Tympanic membrane and external ear normal.     Nose: Nose normal.      Mouth/Throat:     Mouth: Mucous membranes are moist.  Eyes:     General:        Right eye: No discharge.        Left eye: No discharge.     Conjunctiva/sclera: Conjunctivae normal.     Pupils: Pupils are equal, round, and reactive to light.  Neck:     Thyroid: No thyromegaly.     Vascular: No JVD.  Cardiovascular:     Rate and Rhythm: Normal rate and regular rhythm.     Heart sounds: Normal heart sounds. No murmur heard.    No friction rub. No gallop.  Pulmonary:     Effort: Pulmonary effort is normal.     Breath sounds: Normal breath sounds. No wheezing, rhonchi or rales.  Abdominal:     General: Bowel sounds are normal.     Palpations: Abdomen is soft. There is no mass.     Tenderness: There is no abdominal tenderness. There is no guarding.  Musculoskeletal:        General: Normal range of motion.     Cervical back: Normal range of motion and neck supple.  Lymphadenopathy:     Cervical: No cervical adenopathy.  Skin:    General: Skin is warm and dry.  Neurological:     Mental Status: She is alert.     Wt Readings from Last 3 Encounters:  11/04/23 178 lb (80.7 kg)  05/02/23 180 lb (81.6 kg)  04/25/23 182 lb 8 oz (82.8 kg)    BP 118/70   Pulse 94   Ht 5' (1.524 m)   Wt 178 lb (80.7 kg)   SpO2 97%   BMI 34.76 kg/m   Assessment and Plan: 1. Essential hypertension Chronic.  Controlled.  Stable.  Asymptomatic.  Tolerating medications well.  Continue hydrochlorothiazide 12.5 mg once a day and losartan 50 mg once a day.  Review of previous labs are acceptable.  Will recheck patient in 6 months. - hydrochlorothiazide (HYDRODIURIL) 12.5 MG  tablet; Take 1 tablet (12.5 mg total) by mouth daily.  Dispense: 90 tablet; Refill: 1 - losartan (COZAAR) 50 MG tablet; Take 1 tablet (50 mg total) by mouth daily.  Dispense: 90 tablet; Refill: 1  2. Anxiety Chronic.  Controlled.  Stable.  GAD score is 1.  Continue buspirone 5 mg twice a day.  Will recheck patient in 6 months. -  busPIRone (BUSPAR) 5 MG tablet; Take 1 tablet (5 mg total) by mouth 2 (two) times daily.  Dispense: 180 tablet; Refill: 1  3. Reactive depression Chronic.  Controlled.  Stable.  PHQ is 3.  Patient is tolerating sertraline without side effects.  Will continue sertraline 50 mg daily.  Will recheck patient in 6 months on an as needed basis. - sertraline (ZOLOFT) 50 MG tablet; Take 1 tablet (50 mg total) by mouth daily. Take 1 tablet daily  Dispense: 90 tablet; Refill: 1     Elizabeth Sauer, MD

## 2023-11-12 ENCOUNTER — Other Ambulatory Visit: Payer: Self-pay | Admitting: Obstetrics

## 2023-11-12 DIAGNOSIS — Z1231 Encounter for screening mammogram for malignant neoplasm of breast: Secondary | ICD-10-CM

## 2023-12-04 ENCOUNTER — Ambulatory Visit
Admission: RE | Admit: 2023-12-04 | Discharge: 2023-12-04 | Disposition: A | Discharge status: Home / Self Care | Payer: Managed Care, Other (non HMO) | Source: Ambulatory Visit | Attending: Obstetrics | Admitting: Obstetrics

## 2023-12-04 DIAGNOSIS — Z1231 Encounter for screening mammogram for malignant neoplasm of breast: Secondary | ICD-10-CM | POA: Diagnosis present

## 2024-02-13 ENCOUNTER — Telehealth: Payer: Self-pay | Admitting: Gastroenterology

## 2024-02-13 NOTE — Telephone Encounter (Signed)
The patient called in to schedule a follow up visit.

## 2024-02-26 NOTE — Progress Notes (Unsigned)
 Holly Amy, PA-C [address]  [address]  [place], [place] [address]  Main: [phone]  Fax: [phone]   Primary Care Physician: Holly Limerick, MD  Primary Gastroenterologist:  Holly Amy, PA-C / Dr. Midge Foley    CC: Follow-up gastroparesis  HPI: Holly Foley is a 58 y.o. female returns for annual follow-up of gastroparesis.  She last saw Dr. Servando Foley 04/2023 for f/u.  She takes Reglan 5mg  TID prn nausea and vomiting.  She sees endocrinologist for management of diabetes.  Last A1c was 7.1.  01/2021 EGD showed retained food in the stomach consistent with gastroparesis.  Gastric emptying study was attempted but could not be completed because the patient kept vomiting during the test.  Patient has required FMLA for intermittent leave when she has flareups of her gastroparesis.  Symptoms include intermittent episodes of severe nausea and vomiting.  She takes Reglan as needed which helps.  Also treats gastroparesis with low-fat low fiber diet.  Last colonoscopy 04/2017 was normal.  10-year repeat.  Current Outpatient Medications  Medication Sig Dispense Refill   aspirin EC 81 MG tablet Take 1 tablet by mouth daily.     BD PEN NEEDLE NANO U/F 32G X 4 MM MISC   0   busPIRone (BUSPAR) 5 MG tablet Take 1 tablet (5 mg total) by mouth 2 (two) times daily. 180 tablet 1   empagliflozin (JARDIANCE) 10 MG TABS tablet Take 1 tablet by mouth daily.     glimepiride (AMARYL) 4 MG tablet Take 1 tablet (4 mg total) by mouth daily. Holly Foley 30 tablet 36   hydrochlorothiazide (HYDRODIURIL) 12.5 MG tablet Take 1 tablet (12.5 mg total) by mouth daily. 90 tablet 1   losartan (COZAAR) 50 MG tablet Take 1 tablet (50 mg total) by mouth daily. 90 tablet 1   metFORMIN (GLUMETZA) 500 MG (MOD) 24 hr tablet Take 1,000 mg by mouth 2 (two) times daily with a meal.     metoCLOPramide (REGLAN) 5 MG tablet Take 1 tablet (5 mg total) by mouth every 6 (six) hours as needed for nausea. 60 tablet 1    MOUNJARO 5 MG/0.5ML Pen Holly Foley     promethazine (PHENERGAN) 25 MG suppository Place 1 suppository (25 mg total) rectally every 6 (six) hours as needed for nausea or vomiting. 12 each 0   sertraline (ZOLOFT) 50 MG tablet Take 1 tablet (50 mg total) by mouth daily. Take 1 tablet daily 90 tablet 1   TRESIBA FLEXTOUCH 100 UNIT/ML FlexTouch Pen Inject into the skin. Holly Foley     vitamin B-12 (CYANOCOBALAMIN) 1000 MCG tablet Take 1,000 mcg by mouth daily.     No current facility-administered medications for this visit.    Allergies as of 02/27/2024 - Review Complete 11/04/2023  Allergen Reaction Noted   Canagliflozin Rash 01/24/2018    Past Medical History:  Diagnosis Date   Class 2 obesity with serious comorbidity and body mass index (BMI) of 39.0 to 39.9 in adult 04/15/2017   Depression    HLD (hyperlipidemia) 01/31/2016   Hypertension    Recurrent major depressive disorder, in partial remission (HCC) 04/15/2017   Type 2 diabetes mellitus (HCC) 08/24/2014    Past Surgical History:  Procedure Laterality Date   ABLATION     CHOLECYSTECTOMY     COLONOSCOPY WITH PROPOFOL N/A 05/10/2017   Procedure: COLONOSCOPY WITH PROPOFOL;  Surgeon: Holly Minium, MD;  Location: [facility];  Service: Endoscopy;  Laterality: N/A;   ESOPHAGOGASTRODUODENOSCOPY (EGD) WITH PROPOFOL  N/A 01/27/2021   Procedure: ESOPHAGOGASTRODUODENOSCOPY (EGD) WITH PROPOFOL;  Surgeon: Holly Minium, MD;  Location: [facility];  Service: Endoscopy;  Laterality: N/A;  Diabetic - oral meds   HERNIA REPAIR     PILONIDAL CYST EXCISION      Review of Systems:    All systems reviewed and negative except where noted in HPI.   Physical Examination:   There were no vitals taken for this visit.  General: Well-nourished, well-developed in no acute distress.  Lungs: Clear to auscultation bilaterally. Non-labored. Heart: Regular rate and rhythm, no murmurs rubs or gallops.  Abdomen: Bowel sounds are normal; Abdomen is  Soft; No hepatosplenomegaly, masses or hernias;  No Abdominal Tenderness; No guarding or rebound tenderness. Neuro: Alert and oriented x 3.  Grossly intact.  Psych: Alert and cooperative, normal mood and affect.   Imaging Studies: No results found.  Assessment and Plan:   Holly Foley is a 58 y.o. y/o female presents for annual follow-up of:  1.  Diabetic gastroparesis  Refilled Reglan 5 Mg 1 tablet 3 times daily (every 6 hours) as needed for nausea and vomiting.  Gave copy of gastroparesis diet low-fat and low fiber.    Continue follow-up with endocrinologist for tight diabetic control.  FMLA forms completed.  Holly Amy, PA-C  Follow up in 1 year.

## 2024-02-27 ENCOUNTER — Encounter: Payer: Self-pay | Admitting: Physician Assistant

## 2024-02-27 ENCOUNTER — Ambulatory Visit: Payer: Managed Care, Other (non HMO) | Admitting: Physician Assistant

## 2024-02-27 VITALS — BP 128/82 | HR 109 | Temp 97.8°F | Ht 60.0 in | Wt 179.8 lb

## 2024-02-27 DIAGNOSIS — K3184 Gastroparesis: Secondary | ICD-10-CM

## 2024-03-03 LAB — HM DIABETES EYE EXAM

## 2024-03-04 ENCOUNTER — Encounter: Payer: Self-pay | Admitting: Physician Assistant

## 2024-04-01 ENCOUNTER — Encounter: Payer: Self-pay | Admitting: Family Medicine

## 2024-04-16 ENCOUNTER — Encounter: Payer: Self-pay | Admitting: Family Medicine

## 2024-04-16 ENCOUNTER — Ambulatory Visit (INDEPENDENT_AMBULATORY_CARE_PROVIDER_SITE_OTHER): Payer: Self-pay | Admitting: Family Medicine

## 2024-04-16 VITALS — BP 113/81 | HR 79 | Resp 16 | Ht 65.0 in | Wt 176.0 lb

## 2024-04-16 DIAGNOSIS — I10 Essential (primary) hypertension: Secondary | ICD-10-CM | POA: Diagnosis not present

## 2024-04-16 DIAGNOSIS — F419 Anxiety disorder, unspecified: Secondary | ICD-10-CM

## 2024-04-16 DIAGNOSIS — F329 Major depressive disorder, single episode, unspecified: Secondary | ICD-10-CM

## 2024-04-16 DIAGNOSIS — E119 Type 2 diabetes mellitus without complications: Secondary | ICD-10-CM

## 2024-04-16 MED ORDER — GLIMEPIRIDE 4 MG PO TABS: 4.0000 mg | ORAL_TABLET | Freq: Every day | ORAL | 1 refills | Status: DC

## 2024-04-16 MED ORDER — BUSPIRONE HCL 5 MG PO TABS: 5.0000 mg | ORAL_TABLET | Freq: Two times a day (BID) | ORAL | 1 refills | Status: AC

## 2024-04-16 MED ORDER — LOSARTAN POTASSIUM 50 MG PO TABS: 50.0000 mg | ORAL_TABLET | Freq: Every day | ORAL | 1 refills | Status: AC

## 2024-04-16 MED ORDER — SERTRALINE HCL 50 MG PO TABS: 50.0000 mg | ORAL_TABLET | Freq: Every day | ORAL | 1 refills | Status: AC

## 2024-04-16 MED ORDER — HYDROCHLOROTHIAZIDE 12.5 MG PO TABS: 12.5000 mg | ORAL_TABLET | Freq: Every day | ORAL | 1 refills | Status: AC

## 2024-04-16 NOTE — Progress Notes (Signed)
 Date:  04/16/2024   Name:  Holly Foley   DOB:  09/14/1966   MRN:  [id number]   Chief Complaint: Hypertension, Anxiety, and Depression  Hypertension This is a chronic problem. The current episode started more than 1 year ago. The problem has been gradually improving since onset. The problem is controlled. Associated symptoms include anxiety. Pertinent negatives include no blurred vision, chest pain, headaches, malaise/fatigue, neck pain, orthopnea, palpitations, peripheral edema, PND, shortness of breath or sweats. There are no associated agents to hypertension. Past treatments include diuretics and angiotensin blockers. The current treatment provides moderate improvement. There is no history of CAD/MI or CVA. There is no history of chronic renal disease, a hypertension causing med or renovascular disease.  Anxiety Presents for follow-up visit. Symptoms include irritability. Patient reports no chest pain, compulsions, confusion, decreased concentration, excessive worry, feeling of choking, hyperventilation, insomnia, malaise, muscle tension, nausea, nervous/anxious behavior, palpitations, restlessness, shortness of breath or suicidal ideas.    Depression        This is a chronic problem.  The current episode started more than 1 year ago.   The problem has been gradually improving since onset.  Associated symptoms include no decreased concentration, no helplessness, no hopelessness, does not have insomnia, no restlessness, no headaches, not sad and no suicidal ideas.  Past treatments include SSRIs - Selective serotonin reuptake inhibitors.  Compliance with treatment is good.  Previous treatment provided moderate relief.  Past medical history includes anxiety.     Pertinent negatives include no brain trauma.   Lab Results  Component Value Date   NA 133 (L) 05/02/2023   K 4.2 05/02/2023   CO2 19 (L) 05/02/2023   GLUCOSE 246 (H) 05/02/2023   BUN 23 05/02/2023   CREATININE 0.86 05/02/2023    CALCIUM 10.6 (H) 05/02/2023   EGFR 79 05/02/2023   GFRNONAA >60 02/23/2022   Lab Results  Component Value Date   CHOL 163 05/02/2023   HDL 52 05/02/2023   LDLCALC 93 05/02/2023   TRIG 95 05/02/2023   CHOLHDL 2.9 04/15/2017   Lab Results  Component Value Date   TSH 1.450 10/05/2020   Lab Results  Component Value Date   HGBA1C 7.1 10/18/2023   Lab Results  Component Value Date   WBC 14.7 (H) 02/23/2022   HGB 17.6 (H) 02/23/2022   HCT 50.6 (H) 02/23/2022   MCV 83.6 02/23/2022   PLT 317 02/23/2022   Lab Results  Component Value Date   ALT 10 05/02/2023   AST 14 05/02/2023   ALKPHOS 112 05/02/2023   BILITOT 0.4 05/02/2023   No results found for: "25OHVITD2", "25OHVITD3", "VD25OH"   Review of Systems  Constitutional:  Positive for irritability. Negative for malaise/fatigue.  Eyes:  Negative for blurred vision.  Respiratory:  Negative for choking, shortness of breath and wheezing.   Cardiovascular:  Negative for chest pain, palpitations, orthopnea and PND.  Gastrointestinal:  Negative for abdominal pain and nausea.  Musculoskeletal:  Negative for neck pain.  Neurological:  Negative for headaches.  Psychiatric/Behavioral:  Positive for depression. Negative for confusion, decreased concentration and suicidal ideas. The patient is not nervous/anxious and does not have insomnia.     Patient Active Problem List   Diagnosis Date Noted   Intractable vomiting    Special screening for malignant neoplasms, colon    Recurrent major depressive disorder, in partial remission (HCC) 04/15/2017   Reactive depression 04/15/2017   Colon cancer screening 04/15/2017   Class 2 obesity with  serious comorbidity and body mass index (BMI) of 39.0 to 39.9 in adult 04/15/2017   Recurrent ventral incisional hernia 07/03/2016   Periumbilical abdominal pain 06/25/2016   HLD (hyperlipidemia) 01/31/2016   Essential hypertension 01/31/2016   Near syncope 01/31/2016   Fast heart beat 01/31/2016    B12 deficiency 08/31/2014   Diabetes mellitus (HCC) 08/31/2014   Type 2 diabetes mellitus (HCC) 08/24/2014    Allergies  Allergen Reactions   Canagliflozin Rash    Past Surgical History:  Procedure Laterality Date   ABLATION     CHOLECYSTECTOMY     COLONOSCOPY WITH PROPOFOL  N/A 05/10/2017   Procedure: COLONOSCOPY WITH PROPOFOL ;  Surgeon: Marnee Sink, MD;  Location: [facility];  Service: Endoscopy;  Laterality: N/A;   ESOPHAGOGASTRODUODENOSCOPY (EGD) WITH PROPOFOL  N/A 01/27/2021   Procedure: ESOPHAGOGASTRODUODENOSCOPY (EGD) WITH PROPOFOL ;  Surgeon: Marnee Sink, MD;  Location: [facility];  Service: Endoscopy;  Laterality: N/A;  Diabetic - oral meds   HERNIA REPAIR     PILONIDAL CYST EXCISION      Social History   Tobacco Use   Smoking status: Former    Current packs/day: 0.00    Types: Cigarettes    Quit date: 06/02/2022    Years since quitting: 1.8   Smokeless tobacco: Never  Vaping Use   Vaping status: Never Used  Substance Use Topics   Alcohol use: Yes    Alcohol/week: 0.0 standard drinks of alcohol    Comment: occasionally   Drug use: No     Medication list has been reviewed and updated.  Current Meds  Medication Sig   aspirin EC 81 MG tablet Take 1 tablet by mouth daily.   BD PEN NEEDLE NANO U/F 32G X 4 MM MISC    empagliflozin (JARDIANCE) 10 MG TABS tablet Take 1 tablet by mouth daily.   metFORMIN (GLUMETZA) 500 MG (MOD) 24 hr tablet Take 1,000 mg by mouth 2 (two) times daily with a meal.   metoCLOPramide  (REGLAN ) 5 MG tablet Take 1 tablet (5 mg total) by mouth every 6 (six) hours as needed for nausea.   promethazine  (PHENERGAN ) 25 MG suppository Place 1 suppository (25 mg total) rectally every 6 (six) hours as needed for nausea or vomiting.   tirzepatide (MOUNJARO) 7.5 MG/0.5ML Pen Inject 7.5 mg into the skin once a week.   TRESIBA FLEXTOUCH 100 UNIT/ML FlexTouch Pen Inject into the skin. [place]   vitamin B-12 (CYANOCOBALAMIN) 1000 MCG  tablet Take 1,000 mcg by mouth daily.   [DISCONTINUED] busPIRone  (BUSPAR ) 5 MG tablet Take 1 tablet (5 mg total) by mouth 2 (two) times daily.   [DISCONTINUED] glimepiride  (AMARYL ) 4 MG tablet Take 1 tablet (4 mg total) by mouth daily. [name]   [DISCONTINUED] hydrochlorothiazide  (HYDRODIURIL ) 12.5 MG tablet Take 1 tablet (12.5 mg total) by mouth daily.   [DISCONTINUED] losartan  (COZAAR ) 50 MG tablet Take 1 tablet (50 mg total) by mouth daily.   [DISCONTINUED] sertraline  (ZOLOFT ) 50 MG tablet Take 1 tablet (50 mg total) by mouth daily. Take 1 tablet daily       11/04/2023   10:10 AM 05/02/2023    9:54 AM 11/01/2022   10:31 AM 05/01/2022   10:04 AM  GAD 7 : Generalized Anxiety Score  Nervous, Anxious, on Edge 0 0 0 2  Control/stop worrying 0 0 0 0  Worry too much - different things 0 0 0 0  Trouble relaxing 0 0 0 0  Restless 0 0 0 0  Easily annoyed or irritable  1 0 0 1  Afraid - awful might happen 0 0 0 0  Total GAD 7 Score 1 0 0 3  Anxiety Difficulty Not difficult at all Not difficult at all Not difficult at all Not difficult at all       11/04/2023   10:10 AM 05/02/2023    9:54 AM 11/01/2022   10:31 AM  Depression screen PHQ 2/9  Decreased Interest 1 0 0  Down, Depressed, Hopeless 0 0 0  PHQ - 2 Score 1 0 0  Altered sleeping 0 0 0  Tired, decreased energy 1 0 0  Change in appetite 1 0 0  Feeling bad or failure about yourself  0 0 0  Trouble concentrating 0 0 0  Moving slowly or fidgety/restless 0 0 0  Suicidal thoughts 0 0 0  PHQ-9 Score 3 0 0  Difficult doing work/chores Not difficult at all Not difficult at all Not difficult at all    BP Readings from Last 3 Encounters:  04/16/24 113/81  02/27/24 128/82  11/04/23 118/70    Physical Exam Vitals and nursing note reviewed.  Constitutional:      General: She is not in acute distress.    Appearance: She is not diaphoretic.  HENT:     Head: Normocephalic and atraumatic.     Right Ear: External ear normal.      Left Ear: External ear normal.     Nose: Nose normal.  Eyes:     General:        Right eye: No discharge.        Left eye: No discharge.     Conjunctiva/sclera: Conjunctivae normal.     Pupils: Pupils are equal, round, and reactive to light.  Neck:     Thyroid: No thyromegaly.     Vascular: No JVD.  Cardiovascular:     Rate and Rhythm: Normal rate and regular rhythm.     Heart sounds: Normal heart sounds. No murmur heard.    No friction rub. No gallop.  Pulmonary:     Effort: Pulmonary effort is normal.     Breath sounds: Normal breath sounds.  Abdominal:     General: Bowel sounds are normal.     Palpations: Abdomen is soft. There is no mass.     Tenderness: There is no abdominal tenderness. There is no guarding.  Musculoskeletal:        General: Normal range of motion.     Cervical back: Normal range of motion and neck supple.  Lymphadenopathy:     Cervical: No cervical adenopathy.  Skin:    General: Skin is warm and dry.  Neurological:     Mental Status: She is alert.     Deep Tendon Reflexes: Reflexes are normal and symmetric.     Wt Readings from Last 3 Encounters:  04/16/24 176 lb (79.8 kg)  02/27/24 179 lb 12.8 oz (81.6 kg)  11/04/23 178 lb (80.7 kg)    BP 113/81   Pulse 79   Resp 16   Ht 5\' 5"  (1.651 m)   Wt 176 lb (79.8 kg)   BMI 29.29 kg/m   Assessment and Plan: 1. Anxiety Chronic.  Controlled.  Stable.  GAD score is 1.  Continue buspirone  5 mg twice a day.  Will recheck in 6 months. - busPIRone  (BUSPAR ) 5 MG tablet; Take 1 tablet (5 mg total) by mouth 2 (two) times daily.  Dispense: 180 tablet; Refill: 1  2. Type 2 diabetes mellitus without  complication, without long-term current use of insulin (HCC) Chronic.  Controlled.  Followed by endocrinology/Dr. Shelvy Dickens.  3. Essential hypertension (Primary) Chronic.  Controlled.  Stable.  Blood pressure today is 113/81.  Asymptomatic.  Tolerating medication well.  Continue hydrochlorothiazide  12.5 mg and  losartan  50 mg once a day.  Will recheck patient in 6 months. - hydrochlorothiazide  (HYDRODIURIL ) 12.5 MG tablet; Take 1 tablet (12.5 mg total) by mouth daily.  Dispense: 90 tablet; Refill: 1 - losartan  (COZAAR ) 50 MG tablet; Take 1 tablet (50 mg total) by mouth daily.  Dispense: 90 tablet; Refill: 1  4. Reactive depression Chronic.  Controlled.  Stable.  PHQ is 3.  Continue sertraline  50 mg once a day.  Will recheck patient in 6 months. - sertraline  (ZOLOFT ) 50 MG tablet; Take 1 tablet (50 mg total) by mouth daily. Take 1 tablet daily  Dispense: 90 tablet; Refill: 1     Alayne Allis, MD

## 2024-11-16 ENCOUNTER — Other Ambulatory Visit: Payer: Self-pay | Admitting: Nurse Practitioner

## 2024-11-16 DIAGNOSIS — Z1231 Encounter for screening mammogram for malignant neoplasm of breast: Secondary | ICD-10-CM

## 2025-02-02 ENCOUNTER — Ambulatory Visit
